# Patient Record
Sex: Female | Born: 1945 | Race: White | Marital: Married | Smoking: Never smoker
Health system: Northeastern US, Academic
[De-identification: ages and names within clinical notes are randomized; demographics above are authoritative.]

## PROBLEM LIST (undated history)

## (undated) DIAGNOSIS — N200 Calculus of kidney: Secondary | ICD-10-CM

## (undated) DIAGNOSIS — M8580 Other specified disorders of bone density and structure, unspecified site: Secondary | ICD-10-CM

## (undated) HISTORY — PX: ADENOIDECTOMY: SHX28B

## (undated) HISTORY — DX: Calculus of kidney: N20.0

## (undated) HISTORY — PX: CONVERTED PROCEDURE: SHX9999

## (undated) HISTORY — DX: Other specified disorders of bone density and structure, unspecified site: M85.80

## (undated) HISTORY — PX: TONSILLECTOMY: SHX28A

---

## 2005-11-23 DIAGNOSIS — Z91018 Allergy to other foods: Secondary | ICD-10-CM | POA: Insufficient documentation

## 2005-11-23 DIAGNOSIS — T7840XA Allergy, unspecified, initial encounter: Secondary | ICD-10-CM | POA: Insufficient documentation

## 2008-05-10 DIAGNOSIS — H269 Unspecified cataract: Secondary | ICD-10-CM | POA: Insufficient documentation

## 2008-05-10 DIAGNOSIS — H8110 Benign paroxysmal vertigo, unspecified ear: Secondary | ICD-10-CM | POA: Insufficient documentation

## 2008-06-14 DIAGNOSIS — M8580 Other specified disorders of bone density and structure, unspecified site: Secondary | ICD-10-CM | POA: Insufficient documentation

## 2008-06-14 LAB — HM DEXA SCAN

## 2009-01-30 ENCOUNTER — Ambulatory Visit: Payer: Self-pay | Admitting: Primary Care

## 2009-05-13 ENCOUNTER — Ambulatory Visit: Payer: Self-pay | Admitting: Primary Care

## 2009-05-13 ENCOUNTER — Other Ambulatory Visit: Payer: Self-pay | Admitting: Primary Care

## 2009-05-13 ENCOUNTER — Encounter: Payer: Self-pay | Admitting: Gastroenterology

## 2009-05-13 DIAGNOSIS — Z87442 Personal history of urinary calculi: Secondary | ICD-10-CM | POA: Insufficient documentation

## 2009-05-13 LAB — URINALYSIS WITH REFLEX TO MICROSCOPIC
Ketones, UA: NEGATIVE
Nitrite,UA: NEGATIVE
Protein,UA: NEGATIVE mg/dL
Specific Gravity,UA: 1.009 (ref 1.002–1.030)
pH,UA: 6 (ref 5.0–8.0)

## 2009-05-13 LAB — URINE MICROSCOPIC (IQ200)
RBC,UA: 1 /HPF (ref 0–2)
WBC,UA: 10 /HPF — AB (ref 0–5)

## 2009-05-13 NOTE — Progress Notes (Signed)
Reason For Visit   Chief complaint: Blood in urine.  HPI   Last yesterday afternoon she had urinary urgency and she had too quickly   run to the bathroom.  When she wiped herself there was blood on the toilet   paper and in the toilet water.  For the next two hours she had pressure and   urinary urgency.  As the day progressed she had some clots in the urine.    She denied any dysuria.  This morning the urine looked cloudy, but she   denies any blood in the urine.       She denies any recent abdominal pain, fever, chills and nausea.  She did   have some mild lower back pain and did stretches, which helped.  The lower   back painwas likely secondary to being on her feet for a prolonged period   of time the day prior.  A long time ago she had a uti with dysuria, but   nothing like this in the past.   She denies a history of kidney stones.       Medication reconciliation completed by Ivy Lynn, MD.  Allergies   No Known Allergies.  Current Meds   Calcium 600+D 600-400 MG-UNIT Tablet;; RPT  Vitamin C 500 MG Tablet;; RPT  Multivitamins Capsule;; RPT.  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  ROS   CONSTITUTIONAL: felt cold yesterday evening, Appetite good, no fevers,   chills or weight loss  CV: No chest pain, palps or peripheral edema  RESPIRATORY: No cough, wheezing or dyspnea  GI: No nausea/vomiting, abdominal pain, or change in bowel habits  NEURO: No headaches, no lightheadedness.  Vital Signs   Recorded by Roberts,Cornelia on 13 May 2009 01:26 PM  BP:120/70,  RUE,  Sitting,   HR: 88 b/min,  R Radial, Regular,   Weight: 153 lb.  Physical Exam   GENERAL APPEARANCE: Appears stated age, well appearing, NAD  EYES: conjunctiva clear, no icterus  LUNGS: Clear to auscultation without wheeze or crackles, unlabored breathing  HEART: Normal S1,S2 without  murmurs, gallops, or rubs  ABDOMEN: NABS, soft, non-tender, without hepato-splenomegaly, nondistended,   no CVA tenderness  EXTREMITIES: Without clubbing, cyanosis, or edema  PSYCH: alert and orientated x3, mood and affect appropriate.  POCT   URINALYSIS  May 13, 2009   2:05PM     pH:  5..........normal = 5.0-8.0  Leukocytes:  +1..........normal = negative  Nitrite:  neg..........normal = negative  Protein:  neg mg/dL..........normal = negative  Glucose:  normal mg/dL..........normal = negative  Ketones:  neg..........normal = negative  Urobilinogen:  normal mg/dl..........normal = less than 1 mg/dl  Bilirubin:  neg..........normal = negative  Blood:  about 50/Ery..........normal = negative.  Plan   Assessment and Plan:     1. hematuria  She likely passed a kidney stone, also need to rule out UTI, renal cyst and   bladder polyp  Will send urine for urinalysis and culture and lab  Ultrasound of kidneys and bladder  Refer to urology for further evaluation  Advised her to increase fluids  She will call if she develops any further hematuria, fevers, abdominal   pain, back pain or dysuria.  Signature   Electronically signed by: Clement Husbands  M.D.; 05/13/2009 2:06 PM EST;   Chartered loss adjuster.

## 2009-05-15 LAB — AEROBIC CULTURE

## 2009-05-20 ENCOUNTER — Ambulatory Visit: Payer: Self-pay | Admitting: Urology

## 2009-05-20 ENCOUNTER — Other Ambulatory Visit: Payer: Self-pay | Admitting: Urology

## 2009-06-23 ENCOUNTER — Ambulatory Visit: Payer: Self-pay | Admitting: Urology

## 2009-06-25 LAB — MEDICAL CYTOLOGY

## 2009-07-16 ENCOUNTER — Ambulatory Visit: Payer: Self-pay | Admitting: Primary Care

## 2009-07-16 NOTE — Progress Notes (Signed)
Reason For Visit   Chief complaint: Cough, chest tightness.  HPI   Her symptoms started about two weeks ago.  Initially her symptoms improved,   but over the weekend they worsened again.  She thinks she may have done too   much over the weekend and did not rest enough.  She has a dry cough   especially at night, tightness in her chest, headaches and she is tired.    She initially had a sore throat, but that resolved.  She denies   fever/chills, change in appetite, ear pain, nasal congestion, sinus pain,   wheeze, sob, chest pain, nausea, vomiting, abdominal pain, diarrhea,   myalgias and lightheadedness.  Allergies   No Known Allergies.  Current Meds   Multivitamins Capsule;; RPT  Vitamin C 500 MG Tablet;; RPT  Calcium 600+D 600-400 MG-UNIT Tablet;; RPT  Ciprofloxacin HCl 250 MG Tablet;TAKE 1 TABLET TWICE DAILY; Rx.  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  ROS   See HPI.  Vital Signs   Recorded by Fenn-Belt,Lola on 16 Jul 2009 11:47 AM  BP:130/80,  RUE,  Sitting,   HR: 68 b/min,   Weight: 150 lb.  Physical Exam   GENERAL APPEARANCE: Appears stated age, well appearing, NAD  EYES: no icterus, conjuctiva clear  HEENT: normal TM and ear canals, no oropharynx erythema, no thyromegaly, no   cervical or supraclavicular lymphadenopathy  LUNGS: Clear to auscultation without wheeze or crackles, unlabored   breathing, good air entry  HEART: Normal S1,S2 without murmurs, gallops, or rubs  EXTREMITIES: Without clubbing, cyanosis, or edema  .  Plan   A/P:     1. URI  likely viral   symptomatic treatment discussed  tussionex cough syrup q12 prn  increase rest, fluids     call if no improvement.  Signature   Electronically signed by: Clement Husbands  M.D.; 07/16/2009 2:40 PM EST;   Chartered loss adjuster.

## 2009-08-01 ENCOUNTER — Ambulatory Visit
Admit: 2009-08-01 | Disposition: A | Discharge status: Home / Self Care | Payer: Self-pay | Source: Ambulatory Visit | Attending: Gastroenterology | Admitting: Gastroenterology

## 2009-08-01 DIAGNOSIS — Z86010 Personal history of colonic polyps: Secondary | ICD-10-CM | POA: Insufficient documentation

## 2009-08-01 LAB — HM COLONOSCOPY

## 2009-08-04 LAB — SURGICAL PATHOLOGY

## 2009-08-04 NOTE — Op Note (Addendum)
SURGEON:  Sherrlyn Hock, MD  CO-SURGEON:  ASSISTANT:  SURGERY DATE:  08/01/2009      PREPROCEDURE DIAGNOSIS:       History of colon polyp.    POSTPROCEDURE DIAGNOSIS:      Colon polyps.    TITLE OF PROCEDURE:           Colonoscopy.    ANESTHESIA:                   Fentanyl 50 mcg IV, Versed 5 mg IV.    DESCRIPTION OF PROCEDURE:     We discussed the procedure and its                                objectives, limitations and alternatives.  We                                reviewed the risks that included, but were                                not exclusively limited to, perforation,                                bleeding, drug reaction and infection.  We                                also reviewed the risks of sedation.  We                                discussed the possibility that colon cancer,                                colon polyps and important findings might be                                hard to visualize and might be missed by                                colonoscopy.  The patient's questions were                                answered and the patient gave consent.                                  Incremental sedation was given.  The scope                                was inserted into the rectum and carefully                                advanced to the cecum, where the appendiceal  opening and ileocecal valve were seen.  The                                preparation was good.  Careful inspection was                                performed during scope withdrawal.                                  The distal 5 cm of ileum were unremarkable.                                    A 5 mm ascending colon polyp and a 5 mm                                transverse colon polyp were removed with a                                snare.  The remainder of the colon appeared                                unremarkable.  Small internal hemorrhoids       were seen on retroflexed view of the rectum.                                The patient tolerated the procedure well.                                There were no apparent complications.    IMPRESSION:                   Colon polyps.  Because the patient has a                                history of colon polyps, I would recommend                                colonoscopy every 5 years.  However, if                                biopsies from today demonstrate villous or                                tubulovillous tissue, I would recommend                                colonoscopy in 3 years.  I recommend that she  follow a high-fiber, low-fat diet and that                                she try to avoid red and processed meat.  I                                advised her to start taking fish oil 1000 mg                                per day.  Please consider monitoring vitamin                                D levels.  She was advised to avoid using                                aspirin and anti-inflammatory medications and                                to avoid traveling for the next 7 days.  I                                recommend that she advise close relatives to                                begin colonoscopy at age 55 and undergo                                colonoscopy every 5 years.                Electronically Signed and Finalized  by  Sherrlyn Hock, MD 08/04/2009 06:58  _____________________________________________  Sherrlyn Hock, MD      DD:  08/01/2009  DT:  08/04/2009  5:54 A  DVI: [id number]  JH/SS2#[id number]    cc:  Sherrlyn Hock, MD       Christean Leaf, MD

## 2009-08-13 ENCOUNTER — Ambulatory Visit: Payer: Self-pay | Admitting: Primary Care

## 2009-08-14 NOTE — Progress Notes (Addendum)
Allergies   No Known Allergies.  Current Meds   Hydrocod Polst-Chlorphen Polst 10-8 MG/5ML Liquid Extended Release;TAKE 1   TEASPOONFUL EVERY 12 HOURS AS DIRECTED.; Rx  Multivitamins Capsule;; RPT  Vitamin C 500 MG Tablet;; RPT  Calcium 600+D 600-400 MG-UNIT Tablet;; RPT.  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  Vital Signs   Weight 150 pounds, blood pressure 110/80, heart rate 72 and regular,   temperature orally 97.9 degrees.  Misc. 10   CC: Had IV in right arm at level of the flexor crease of right elbow for   colonoscopy last week, having persistent redness and itching, concern   regarding infection.     HPI: [name] is a 64 year old woman who underwent colonoscopy last week, she   recalls having an IV in her right arm and some tape was placed around twice   a day around her right elbow flexor crease.  Upon removal of the adhesive   or tape she noticed some itching at that time and then that has progressed   since, and she has some patchy redness which is itchy and looks irritated   but is not painful.  She has no fever or chills.  No lymphangitic streaking   up the arm.  No pain on movement of the elbow.     EXAM: She has a patchy contact appearing dermatitis margins of which   approximate a square-shaped adhesive piece put on her arm last week.  No   evidence of cellulitis.  The elbow moves freely and there is no fluid or   signs of elbow infection.  No lymphangitic streaking.  She has no axillary   adenopathy in the right.  Her forearm and hand look normal.     ASSESSMENT AND PLANS:     Contact dermatitis from adhesive tape occurring last week.  I will treat   this with triamcinolone 0.1% cream on a twice a day basis  until clear, she   is asked to call our office next week just to give a progress report.  She   is instructed to call sooner if necessary if the condition worsens or is   not otherwise doing well.  Signature   Electronically signed by: Loreen Freud  M.D.; 08/13/2009 4:31 PM EST.  Electronically signed by: Clement Husbands  M.D.; 08/14/2009 9:55 AM EST.

## 2009-12-30 ENCOUNTER — Other Ambulatory Visit: Payer: Self-pay | Admitting: Urology

## 2009-12-30 ENCOUNTER — Ambulatory Visit: Payer: Self-pay | Admitting: Urology

## 2010-03-03 NOTE — Letter (Signed)
 December 30, 2009    Christean Leaf, MD  [address]  [address]  [place], [place]  [address]      RE:   [name], Miranda  DOB:  01-30-1946  Unit#: [id number]    Dear Dr. Jarold Song:    I had the pleasure of seeing [name] in the urology clinic today in  follow-up for her history of hematuria and calcification in the left  kidney.  She had a one-shot x-ray prior to her appointment today.  It  showed no evidence of renal or ureteral calculi.  Ms. Jose has had  no flank pain, gross hematuria, dysuria, or urinary tract infections.  She  overall appears to be doing quite well.  At this point in time, given the  results of the KUB, she need only follow up with me on a p.r.n. basis.    I will keep you updated regarding all urologic care.        Sincerely,              Electronically Signed and Finalized by  Hedda Slade, MD 01/05/2010 08:47  ____________________________________  Hedda Slade, MD          DD:   12/30/2009  DT:   12/31/2009  9:03 A  DVI:  [id number]  JO/LD4#[id number]      cc:   Christean Leaf, MD

## 2010-03-03 NOTE — Letter (Signed)
May 20, 2009    Christean Leaf, MD  [address] [name], [place]  [address]      RE:   Miranda, [name]  DOB:  03-05-1945  Unit#: [id number]    Dear Dr. Jarold Song:    I saw [name] in urology clinic today with a chief complaint of  blood in the urine.  As you know, Ms. Sucher is a 65 year old female  with a history of gross painless hematuria who presents for further  evaluation.  She had a renal ultrasound completed, which showed stones.  She said she had severe urgency associated with the hematuria over a 2-hour  period, and the blood had resolved by several hours later that day.  She  has never had a previous incident similar to this.  She comes in for  further evaluation.    CURRENT MEDICATIONS:  Cipro currently for a bladder infection, which was  diagnosed after the gross hematuria.    ALLERGIES:  No latex allergy.  No known drug allergies.    REVIEW OF SYSTEMS:  Neurologic:  Positive for dizzy spells.  Ear/nose/throat:  Ringing in her ears.  Genitourinary:  Urinary retention.  The remaining review of systems is negative.    PAST SURGICAL HISTORY:  Tonsillectomy.    PAST MEDICAL HISTORY:  Benign positional vertigo, cataracts, and  osteopenia.    FAMILY HISTORY:  Breast cancer, gallbladder cancer, atrial fibrillation,  and hypertension.    SOCIAL HISTORY:  No tobacco.  One to 2 glasses of wine a week.    PHYSICAL EXAMINATION:  On physical exam, this is a well-developed,  well-nourished female in no acute distress.  Vital signs are reviewed and  recorded in [employer].  Neck is supple.  Respiratory effort is easy.  Lungs are clear.  Heart is regular rate and rhythm.  Abdomen is soft,  positive bowel sounds, nontender, nondistended.  No CVA tenderness  bilaterally.  No cervical, groin, or axillary lymphadenopathy.  The skin  and subcutaneous tissue are normal.  She is alert and oriented x4.  Mood  and affect are normal.    LABORATORY DATA:  Urine analysis today is normal,  showing no evidence of  infection or blood.  Her culture from the 22nd showed greater than 100,000  E. coli.    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  I have  contacted [facility] to see if they can correct this  discrepancy.  She does have scattered nonobstructing stones measuring up to  3 mm.    A KUB, which I obtained after her visit today, was unable to show the  evidence of these calcifications, but which I did tell Avree were so small it  is unlikely that this had passed and caused the gross hematuria she  experienced.  It is more likely that it was secondary to the severe bladder  infection that she did have.  In order to further evaluate her lower  tracts, we will schedule her for cystoscopy.  I explained this procedure to  her in detail.  We will get this on the schedule in the very near future.      I will keep you updated regarding all urologic care.    Sincerely,  Electronically Signed and Finalized by  Hedda Slade, MD 06/03/2009 14:19  ____________________________________  Hedda Slade, MD          DD:   05/20/2009  DT:   05/20/2009  4:34 P  DVI:  [id number]  JO/VL#[id number]      cc:   Christean Leaf, MD

## 2010-03-03 NOTE — Miscellaneous (Unsigned)
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)     Family History  Fraternal history of Adult Sleep Apnea  Paternal history of Atrial Fibrillation  Maternal history of Breast Cancer (V16.3)   Maternal history of Carcinoma Of The Gallbladder  Fraternal history of Hypertension (V17.49)   Maternal history of Malignant Hyperthermia Due To Anesthesia    Social History  Being A Social Drinker  Daily Coffee Consumption (___ Cups/Day)  Daily Tea Consumption (___ Cups/Day)  Marital History - Currently Married  Never Smoked  Occupation:  Seeing A Restaurant manager, fast food - [employer]  No Housing Without Psychologist, sport and exercise  No Lack Of Adequate Sleep  No Tobacco Use  No Drug Use    Alerts  Allergy - No Known Allergies     Medications  Calcium 600+D 600-400 MG-UNIT Tablet ; RPT   Multivitamins Capsule ; RPT   Triamcinolone Acetonide 0.1 % Cream; APPLY SPARINGLY AND MASSAGE IN TWICE   DAILY. ; Rx   Vitamin C 500 MG Tablet ; RPT     Immunizations  Td   Tdap (Adacel)   Zoster (Zostavax)

## 2010-03-03 NOTE — Letter (Signed)
Jun 23, 2009    Christean Leaf, MD  [address]  [place], [place]  [address]      RE:   Miranda, [name]  DOB:  Feb 08, 1946  Unit#: [id number]    Dear Dr. Jarold Song:    [name] underwent an uncomplicated cystoscopy today for her history  of gross hematuria.    Cystoscopy showed no evidence of bladder masses, tumors, or stones, clear  efflux from the bilateral ureteral orifices.  She had bladder neck polyp  which is pretty common but otherwise no evidence of abnormality.  I did  complete a bladder wash for cytology, and bilateral vaginal exam was  normal.    At this point in time, [name] will follow up with me in 6 months with a KUB  prior to assess her history of upper tract calcifications.    I will keep you updated regarding all urologic care.      Sincerely,              Electronically Signed and Finalized by  Hedda Slade, MD 06/30/2009 13:37  ____________________________________  Hedda Slade, MD          DD:   06/23/2009  DT:   06/24/2009  1:16 A  DVI:  [id number]  UX/LK#[id number]      cc:   Christean Leaf, MD

## 2010-12-02 LAB — HM MAMMOGRAPHY

## 2010-12-03 ENCOUNTER — Encounter: Payer: Self-pay | Admitting: Gastroenterology

## 2010-12-25 ENCOUNTER — Encounter: Payer: Self-pay | Admitting: Primary Care

## 2011-03-01 ENCOUNTER — Encounter: Payer: Self-pay | Admitting: Primary Care

## 2011-03-01 ENCOUNTER — Ambulatory Visit: Payer: Self-pay | Admitting: Primary Care

## 2011-03-01 VITALS — BP 112/80 | HR 60 | Ht 59.5 in | Wt 155.0 lb

## 2011-03-01 DIAGNOSIS — J069 Acute upper respiratory infection, unspecified: Secondary | ICD-10-CM

## 2011-03-01 MED ORDER — CHLORPHENIRAMINE-HYDROCODONE 8-10 MG/5ML PO LQCR *A*
5.0000 mL | Freq: Two times a day (BID) | ORAL | Status: DC | PRN
Start: 2011-03-01 — End: 2011-04-15

## 2011-04-15 ENCOUNTER — Encounter: Payer: Self-pay | Admitting: Primary Care

## 2011-04-15 ENCOUNTER — Ambulatory Visit: Payer: Self-pay | Admitting: Primary Care

## 2011-04-15 VITALS — BP 122/80 | HR 68 | Temp 96.7°F | Ht 59.45 in | Wt 156.2 lb

## 2011-04-15 DIAGNOSIS — N390 Urinary tract infection, site not specified: Secondary | ICD-10-CM

## 2011-04-15 LAB — POCT URINALYSIS DIPSTICK
Bilirubin,Ur: NEGATIVE
Glucose,UA POCT: NORMAL
Ketones,UA POCT: NEGATIVE
Leuk Esterase,UA POCT: 2 — AB
Lot #: 21819501
Nitrite,UA POCT: NEGATIVE
PH,UA POCT: 5 (ref 5–8)
Specific gravity,UA POCT: 1.015 (ref 1.002–1.03)
Urobilinogen,UA: NORMAL

## 2011-04-15 LAB — URINALYSIS REFLEX TO CULTURE
Ketones, UA: NEGATIVE
Nitrite,UA: NEGATIVE
Protein,UA: 30 mg/dL — AB
Specific Gravity,UA: 1.017 (ref 1.002–1.030)
pH,UA: 6 (ref 5.0–8.0)

## 2011-04-15 LAB — URINE MICROSCOPIC (IQ200)
RBC,UA: 366 /hpf — AB (ref 0–2)
WBC,UA: 725 /hpf — ABNORMAL HIGH (ref 0–5)

## 2011-04-15 MED ORDER — CIPROFLOXACIN HCL 500 MG PO TABS *I*
500.0000 mg | ORAL_TABLET | Freq: Two times a day (BID) | ORAL | Status: AC
Start: 2011-04-15 — End: 2011-04-20

## 2011-04-15 NOTE — Addendum Note (Signed)
Addended by: Oswaldo Milian on: 04/15/2011 11:50 AM     Modules accepted: Orders

## 2011-04-15 NOTE — Progress Notes (Signed)
Subjective:      Patient ID: [name] is a 66 y.o. year old female.    Chief Complaint   Patient presents with    Urinary Tract Infection    Urinary Frequency       HPI:    Presents to the office today with two-day duration of urinary frequency, slight burning on urination, and urinary urgency.  She has had urinary tract infection the past.  She denies any fever, chills, back pain, abdominal pain, hematuria, or vaginal discharge.    Medication reviewed and updated.    Allergy /Medications/ Problem List/ Social History / :  No Known Allergies (drug, envir, food or latex)  Current Outpatient Prescriptions on File Prior to Visit   Medication Sig Dispense Refill    Calcium Carbonate-Vitamin D (CALCIUM 600 + D) 600-400 MG-UNIT per tablet     0    Ascorbic Acid (VITAMIN C) 500 MG tablet     0    Multiple Vitamin (MULTIVITAMIN) capsule     0     Patient Active Problem List   Diagnoses Code    Allergic Reaction 995.3    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 V15.05    Cataract 366.9    Benign Paroxysmal Positional Vertigo 386.11    Osteopenia 733.90    Gross Hematuria 599.71    Benign Polyps Of The Large Intestine 211.3     History   Substance Use Topics    Smoking status: Never Smoker     Smokeless tobacco: Never Used    Alcohol Use: Not on file         Objective:     Recent Lab Results:      No results found for this basename: Na, K, Cl, CO2, UN, creat, GFRC, GFRB, glu, HA1c, ca, PO4, PTH, VID25, UTPR, UCRR, malbr, malbm, mac, TSH, CHOL, TRIG, HDL, LDLC, CHHDCr      No results found for this basename: WBC, HGB, HCT, PLT, FE, IBC, FESAT, FER          Physical Exam:    Filed Vitals:    04/15/11 1019   BP: 122/80   Pulse: 68   Temp: 35.9 C (96.7 F)   Height: 1.51 m (4' 11.45")   Weight: 70.852 kg (156 lb 3.2 oz)      Estimated Body mass index is 31.07 kg/(m^2) as calculated from the following:    Height as of this encounter: 4' 11.449"(1.51 m).    Weight as of this encounter: 156 lb 3.2 oz(70.852 kg).   SpO2  Readings from Last 3 Encounters:   No data found for SpO2       General:  Alert, cooperative, nontoxic, and in NAD.  Lungs:  CTA without wheezes, rales, or rhonchi.    Heart:  S1 and S2 are noted. RRR. No murmurs, rubs, or gallops.    Abdomen: Soft, nontender, nondistended, normoactive bowel sounds, no organomegaly or rebound tenderness.  No CVA tenderness.      POCT Urinalysis Dipstick [[id number]] (Abnormal) Collected:04/15/11 1035 Specific gravity,UA POCT 1.015 Updated:04/15/11 1036 PH,UA POCT 5.0 Leuk Esterase,UA POCT +2 (A) Nitrite,UA POCT Negative Protein,UA POCT + 30mg /dL (A) mg/dL Glucose,UA POCT Normal Ketones,UA POCT Negative Urobilinogen,UA Normal Bilirubin,Ur Negative Blood,UA POCT About 250 (A) Exp date 05/2012 Lot # [id number]            Assessment / Plan:     1. UTI (lower urinary tract infection) -Urine dipstick is suggestive of urinary tract infection.  Will treat  with Cipro 500 mg by mouth twice a day x5 days.  Recommended increasing fluids.  She will call if her symptoms worsen or don't improve. Urinalysis with reflex to culture     POCT Urinalysis Dipstick            Orders Placed This Encounter   Procedures    POCT Urinalysis Dipstick     Patient's Medications   New Prescriptions    CIPROFLOXACIN (CIPRO) 500 MG TABLET    Take 1 tablet (500 mg total) by mouth 2 times daily for 5 days   Previous Medications    ASCORBIC ACID (VITAMIN C) 500 MG TABLET        CALCIUM CARBONATE-VITAMIN D (CALCIUM 600 + D) 600-400 MG-UNIT PER TABLET        MULTIPLE VITAMIN (MULTIVITAMIN) CAPSULE       Modified Medications    No medications on file   Discontinued Medications    CHLORPHENIRAMINE-HYDROCODONE (TUSSIONEX) 10-8 MG/5ML EXTENDED RELEASE SUSPENSION    Take 5 mLs by mouth every 12 hours as needed for Cough         Signed: Berton Lan, PA on 04/15/2011 at 10:37 AM

## 2011-04-15 NOTE — Patient Instructions (Signed)
A bladder infection (cystitis), or kidney infection (pyelonephritis) will usually respond to antibiotics. These are medications that kill germs. Take all the medicine given to you until it is gone. You may feel better in a few days, but TAKE ALL MEDICINE or the infection may not respond and become more difficult to treat. Response can generally be expected in 7 to 10 days.     HOME CARE INSTRUCTIONS    Drink a lot of fluid, 3 to 4 quarts a day. Cranberry juice is especially recommended, in addition to large amounts of water.    Avoid caffeine, tea and carbonated beverages (Coke, 7-Up, etc). They tend to irritate the bladder.    Alcohol may irritate the prostate.    Only take over-the-counter or prescription medicines for pain, discomfort, or fever as directed by your caregiver     TO PREVENT FURTHER INFECTIONS:    Empty the bladder often. Avoid holding urine for long periods of time.    After a bowel movement, women should cleanse from front to back. Use each tissue only once.    Empty the bladder before and after sexual intercourse.    If you develop back pain, fever, feel sick to your stomach (nausea), vomiting, or your problems (symptoms) are no better in 3 days, return to your caregiver. Return sooner if you are getting worse.   SEEK IMMEDIATE MEDICAL CARE IF YOU:    Develop severe back pain or lower abdominal pain.    Develop chills and fever.    Develop nausea or vomiting.    Have continued burning or discomfort with urination.

## 2011-04-16 LAB — AEROBIC CULTURE: Aerobic Culture: 0

## 2011-04-19 ENCOUNTER — Telehealth: Payer: Self-pay | Admitting: Primary Care

## 2011-04-19 NOTE — Telephone Encounter (Signed)
Message copied by Oliva Bustard on Mon Apr 19, 2011 11:46 AM  ------       Message from: Dorthie Santini L       Created: Mon Apr 19, 2011 11:44 AM                       ----- Message -----          From: Berton Lan, [place]          Sent: 04/19/2011  10:08 AM            To: Marthenia Rolling [place]              Can you call her and ask her if her symptoms of UTI have resolved?  Her urine came back as contaminant which means that it wasn't the greatest sample. Thanks

## 2011-04-19 NOTE — Telephone Encounter (Signed)
Pt notified of results. Her urinary symptoms have resolved.

## 2011-11-16 ENCOUNTER — Encounter: Payer: Self-pay | Admitting: Primary Care

## 2011-12-17 ENCOUNTER — Ambulatory Visit: Payer: Self-pay | Admitting: Primary Care

## 2011-12-17 ENCOUNTER — Encounter: Payer: Self-pay | Admitting: Primary Care

## 2011-12-17 ENCOUNTER — Telehealth: Payer: Self-pay | Admitting: Primary Care

## 2011-12-17 VITALS — BP 110/80 | HR 64 | Temp 96.8°F | Wt 154.0 lb

## 2011-12-17 DIAGNOSIS — R059 Cough, unspecified: Secondary | ICD-10-CM

## 2011-12-17 DIAGNOSIS — R0781 Pleurodynia: Secondary | ICD-10-CM

## 2011-12-17 DIAGNOSIS — J069 Acute upper respiratory infection, unspecified: Secondary | ICD-10-CM

## 2011-12-17 MED ORDER — CHLORPHENIRAMINE-HYDROCODONE 8-10 MG/5ML PO LQCR *A*
ORAL | Status: AC
Start: 2011-12-17 — End: 2011-12-21

## 2011-12-17 NOTE — Telephone Encounter (Signed)
Patient husband called stating the patient is experiencing cold like symptoms for about a week now.  Patient complains of cough, lungs hurting, congestion, raspy throat- not sure if she has a fever  Patient was hoping to get appointment today  Patient is aware SV is not in the office today  [phone]

## 2011-12-17 NOTE — Telephone Encounter (Signed)
Coming in today at 4:20p pt notified

## 2011-12-17 NOTE — Telephone Encounter (Signed)
Ok to schedule with me

## 2011-12-17 NOTE — Progress Notes (Signed)
Subjective:    CC: cough, back pain    Patient ID: [name] is a 66 y.o. female    HPI:  66 year old female who presents with a 3 to four-day history of cold symptoms.  She is been having a bad dry cough.  Yesterday she noticed feeling very achy all over.  No fevers or chills.  She denies any sinus drainage, sore throat, ear pain.  This morning when she woke up she was  coughing and noticed that she was having discomfort in her left side of chest/back.  Notices that the pain worsens with coughing, deep breathing.  Does make her breathing shallow a results of the pain.    Non-smoker. No recent travel. No leg swelling, or pain.  Using some cough medication for symptoms. Typically tussionex in the past would help.      Allergies, medications, reviewed, updated as documented below and in EMR    Current Outpatient Prescriptions   Medication   . Calcium Carbonate-Vitamin D (CALCIUM 600 + D) 600-400 MG-UNIT per tablet   . Ascorbic Acid (VITAMIN C) 500 MG tablet   . Multiple Vitamin (MULTIVITAMIN) capsule     No current facility-administered medications for this visit.    and No Known Allergies (drug, envir, food or latex)    Review of Systems    As per hpi    Objective:     Filed Vitals:    12/17/11 1619   BP: 110/80   Pulse: 64   Temp: 36 C (96.8 F)   Weight: 69.854 kg (154 lb)   02 sat 98     Physical Exam   Vitals reviewed.  Constitutional: She is oriented to person, place, and time. She appears well-developed and well-nourished.   Cardiovascular: Normal rate, regular rhythm and normal heart sounds.    Pulmonary/Chest: Effort normal and breath sounds normal. No respiratory distress. She has no wheezes. She has no rales. She exhibits no tenderness.   Pain noted with deep breathing coughing   Neurological: She is alert and oriented to person, place, and time.   Skin: Skin is warm.   Psychiatric: She has a normal mood and affect.       Assessment and Plan:     66 year old female with pleuritic chest pains.   Suspect viral respiratory infection, possible pna.  Differential diagnosis does include rib fracture secondary to cough (however no pain with palpation).      -Chest x-ray  -Hold on antibiotics at this time as I suspect this is a viral process  -Prescription given for Tussionex.   I have checked the prescription monitoring program and have noted no issues with this controlled substance prescription.  -discussed the importance of fluids, rest, use of tylenol or advil for pain.    Call if any fevers, chills, worsened pain, difficulty breathing or more productive cough of purulent sputum or no resolution in symptoms in 3-5 days

## 2011-12-17 NOTE — Patient Instructions (Signed)
-   Use Tylenol OTC as needed to help control pain symptoms  - Use Ibuprofen OTC as needed to help control pain symptoms  - Side effects and proper use of narcotic cough suppressants discussed  - Steam treatments for congestion  - Push fluids to maintain proper hydration status  - Call office if symptoms are unchanged, progress, or worsen.  Such as if sputum turns green or brown, fever above 101 F, any shortness of breath, symptoms worsen after 5 to 7 days

## 2011-12-20 ENCOUNTER — Telehealth: Payer: Self-pay | Admitting: Primary Care

## 2011-12-20 NOTE — Telephone Encounter (Signed)
Notified patient of results. She said she feels that she is improving. She slept a lot over the weekend and feels better today. Advised her that is she does not continue to improve to call the office.

## 2011-12-20 NOTE — Telephone Encounter (Signed)
Message copied by Lynnae Prude on Mon Dec 20, 2011  3:52 PM  ------       Message from: [name]       Created: Mon Dec 20, 2011  3:15 PM         Call patient. Xray normal.  How has she been feeling?  ------

## 2012-03-23 ENCOUNTER — Encounter: Payer: Self-pay | Admitting: Gastroenterology

## 2012-03-23 LAB — HM MAMMOGRAPHY

## 2012-03-28 ENCOUNTER — Encounter: Payer: Self-pay | Admitting: Primary Care

## 2012-04-21 ENCOUNTER — Telehealth: Payer: Self-pay | Admitting: Primary Care

## 2012-04-21 ENCOUNTER — Other Ambulatory Visit: Payer: Self-pay | Admitting: Orthopedic Surgery

## 2012-04-21 ENCOUNTER — Other Ambulatory Visit: Payer: Self-pay | Admitting: Emergency Medicine

## 2012-04-21 ENCOUNTER — Encounter: Payer: Self-pay | Admitting: Emergency Medicine

## 2012-04-21 ENCOUNTER — Inpatient Hospital Stay
Admission: EM | Admit: 2012-04-21 | Disposition: A | Discharge status: Home / Self Care | Payer: Self-pay | Source: Ambulatory Visit | Attending: Orthopedic Surgery | Admitting: Orthopedic Surgery

## 2012-04-21 DIAGNOSIS — S82409A Unspecified fracture of shaft of unspecified fibula, initial encounter for closed fracture: Secondary | ICD-10-CM

## 2012-04-21 DIAGNOSIS — S82209A Unspecified fracture of shaft of unspecified tibia, initial encounter for closed fracture: Secondary | ICD-10-CM

## 2012-04-21 DIAGNOSIS — Z8489 Family history of other specified conditions: Secondary | ICD-10-CM | POA: Diagnosis present

## 2012-04-21 HISTORY — DX: Unspecified fracture of shaft of unspecified fibula, initial encounter for closed fracture: S82.409A

## 2012-04-21 HISTORY — DX: Unspecified fracture of shaft of unspecified tibia, initial encounter for closed fracture: S82.209A

## 2012-04-21 LAB — BASIC METABOLIC PANEL
Anion Gap: 11 (ref 7–16)
CO2: 27 mmol/L (ref 20–28)
Calcium: 9.2 mg/dL (ref 8.6–10.2)
Chloride: 106 mmol/L (ref 96–108)
Creatinine: 0.85 mg/dL (ref 0.51–0.95)
GFR,Black: 81 *
GFR,Caucasian: 71 *
Glucose: 115 mg/dL — ABNORMAL HIGH (ref 60–99)
Lab: 19 mg/dL (ref 6–20)
Potassium: 4.6 mmol/L (ref 3.3–5.1)
Sodium: 144 mmol/L (ref 133–145)

## 2012-04-21 LAB — CBC AND DIFFERENTIAL
Baso # K/uL: 0 10*3/uL (ref 0.0–0.1)
Basophil %: 0.2 % (ref 0.1–1.2)
Eos # K/uL: 0 10*3/uL (ref 0.0–0.4)
Eosinophil %: 0 % — ABNORMAL LOW (ref 0.7–5.8)
Hematocrit: 40 % (ref 34–45)
Hemoglobin: 14.2 g/dL (ref 11.2–15.7)
Lymph # K/uL: 1 10*3/uL — ABNORMAL LOW (ref 1.2–3.7)
Lymphocyte %: 11.1 % — ABNORMAL LOW (ref 19.3–51.7)
MCV: 88 fL (ref 79–95)
Mono # K/uL: 0.5 10*3/uL (ref 0.2–0.9)
Monocyte %: 5.9 % (ref 4.7–12.5)
Neut # K/uL: 7.2 10*3/uL — ABNORMAL HIGH (ref 1.6–6.1)
Platelets: 300 10*3/uL (ref 160–370)
RBC: 4.6 MIL/uL (ref 3.9–5.2)
RDW: 14.1 % (ref 11.7–14.4)
Seg Neut %: 82.8 % — ABNORMAL HIGH (ref 34.0–71.1)
WBC: 8.7 10*3/uL (ref 4.0–10.0)

## 2012-04-21 LAB — PROTIME-INR
INR: 1 (ref 1.0–1.2)
Protime: 10.3 s (ref 9.2–12.3)

## 2012-04-21 LAB — HOLD LAVENDER

## 2012-04-21 LAB — HOLD RED

## 2012-04-21 LAB — BLOOD BANK HOLD RED

## 2012-04-21 LAB — APTT: aPTT: 28.1 s (ref 25.8–37.9)

## 2012-04-21 LAB — HOLD BLUE

## 2012-04-21 LAB — HOLD SST

## 2012-04-21 LAB — BLOOD BANK HOLD LAVENDER

## 2012-04-21 LAB — HOLD GREEN WITH GEL

## 2012-04-21 MED ORDER — SODIUM CHLORIDE 0.9 % IV SOLN WRAPPED *I*
100.0000 mL/h | Status: DC
Start: 2012-04-21 — End: 2012-04-23
  Administered 2012-04-21 (×2): 100 mL/h via INTRAVENOUS

## 2012-04-21 MED ORDER — OXYCODONE-ACETAMINOPHEN 5-325 MG PO TABS *I*
2.0000 | ORAL_TABLET | ORAL | Status: DC | PRN
Start: 2012-04-21 — End: 2012-04-23

## 2012-04-21 MED ORDER — ONDANSETRON HCL 2 MG/ML IV SOLN *I*
4.0000 mg | Freq: Four times a day (QID) | INTRAMUSCULAR | Status: DC | PRN
Start: 2012-04-21 — End: 2012-04-23

## 2012-04-21 MED ORDER — HYDROMORPHONE HCL 2 MG/ML IJ SOLN *WRAPPED*
0.4000 mg | INTRAMUSCULAR | Status: DC | PRN
Start: 2012-04-21 — End: 2012-04-21

## 2012-04-21 MED ORDER — CALCIUM CARBONATE-VITAMIN D 500-200 MG-UNIT PO TABS *WRAPPED*
1.0000 | ORAL_TABLET | Freq: Every day | ORAL | Status: DC
Start: 2012-04-22 — End: 2012-04-23
  Administered 2012-04-22: 1 via ORAL
  Filled 2012-04-21 (×2): qty 1

## 2012-04-21 MED ORDER — PROPOFOL 10 MG/ML IV EMUL (INTERMITTENT DOSING) WRAPPED *I*
INTRAVENOUS | Status: DC
Start: 2012-04-21 — End: 2012-04-21
  Filled 2012-04-21: qty 60

## 2012-04-21 MED ORDER — PROMETHAZINE HCL 25 MG/ML IJ SOLN *I*
6.2500 mg | Freq: Once | INTRAMUSCULAR | Status: DC | PRN
Start: 2012-04-21 — End: 2012-04-21

## 2012-04-21 MED ORDER — CEFAZOLIN SODIUM 1000 MG IJ SOLR *I*
1000.0000 mg | Freq: Three times a day (TID) | INTRAMUSCULAR | Status: AC
Start: 2012-04-21 — End: 2012-04-22
  Administered 2012-04-22 (×2): 1000 mg via INTRAVENOUS
  Filled 2012-04-21: qty 10

## 2012-04-21 MED ORDER — HYDROMORPHONE HCL PF 1 MG/ML IJ SOLN *WRAPPED*
0.2500 mg | INTRAMUSCULAR | Status: DC | PRN
Start: 2012-04-21 — End: 2012-04-23

## 2012-04-21 MED ORDER — BISACODYL 10 MG RE SUPP *I*
10.0000 mg | Freq: Every day | RECTAL | Status: DC | PRN
Start: 2012-04-21 — End: 2012-04-23

## 2012-04-21 MED ORDER — ENOXAPARIN SODIUM 40 MG/0.4ML IJ SOSY *I*
40.0000 mg | PREFILLED_SYRINGE | INTRAMUSCULAR | Status: DC
Start: 2012-04-22 — End: 2012-04-23

## 2012-04-21 MED ORDER — HALOPERIDOL LACTATE 5 MG/ML IJ SOLN *I*
0.5000 mg | Freq: Once | INTRAMUSCULAR | Status: DC | PRN
Start: 2012-04-21 — End: 2012-04-21

## 2012-04-21 MED ORDER — SENNOSIDES 8.6 MG PO TABS *I*
2.0000 | ORAL_TABLET | Freq: Every day | ORAL | Status: DC
Start: 2012-04-22 — End: 2012-04-23

## 2012-04-21 MED ORDER — ONDANSETRON HCL 2 MG/ML IV SOLN *I*
1.0000 mg | Freq: Once | INTRAMUSCULAR | Status: DC | PRN
Start: 2012-04-21 — End: 2012-04-21

## 2012-04-21 MED ORDER — PROPOFOL 10 MG/ML IV EMUL (INTERMITTENT DOSING) WRAPPED *I*
INTRAVENOUS | Status: AC | PRN
Start: 2012-04-21 — End: 2012-04-21
  Administered 2012-04-21: 50 mg via INTRAVENOUS

## 2012-04-21 MED ORDER — DOCUSATE SODIUM 100 MG PO CAPS *I*
200.0000 mg | ORAL_CAPSULE | Freq: Every evening | ORAL | Status: DC
Start: 2012-04-22 — End: 2012-04-23

## 2012-04-21 MED ORDER — MAGNESIUM HYDROXIDE 400 MG/5ML PO SUSP *I*
30.0000 mL | Freq: Every day | ORAL | Status: DC | PRN
Start: 2012-04-21 — End: 2012-04-23
  Filled 2012-04-21 (×2): qty 30

## 2012-04-21 MED ORDER — OXYCODONE-ACETAMINOPHEN 5-325 MG PO TABS *I*
1.0000 | ORAL_TABLET | ORAL | Status: DC | PRN
Start: 2012-04-21 — End: 2012-04-23

## 2012-04-21 NOTE — Progress Notes (Signed)
Patient:[name]  MRN: [id number]  DOA: 04/21/2012  Ortho Progress Note for 04/21/2012    Events:   Doing well, pain controlled in pacu      Vitals:  Filed Vitals:    04/21/12 2030   BP:    Pulse:    Temp:    Resp: 16   Height:    Weight:        Intake/Output:  I/O this shift:  02/28 1500 - 02/28 2259  In: 1800 (26.1 mL/kg) [I.V.:1800]  Out: 20 (0.3 mL/kg) [Blood:20]  Net: 1780  Weight used: 68.9 kg    Recent Labs:    Recent Labs  Lab 04/21/12  1523   WBC 8.7   Hematocrit 40   Creatinine 0.85   INR 1.0   Platelets 300   Potassium 4.6       Physical Exam:  NAD, AAOx3  LLE:  Splint c/d/i  Motor: Able to actively dorsiflex and plantarflex at the ankle, actively flexes and extends all toes   Sensory: SILT med/lat thigh, med/lat leg, and medial/lateral/dorsal/plantar surfaces of foot including first web space  all digits wwp, cap refill <3secs      Assessment and Plan:  67 y.o. female admitted on 04/21/2012 now s/p ORIF L ankle on 04/21/12    -Analgesia: percocet  -Diet: regular  -DVT Prophylaxis: lovenox, will DC on ASA 325mg  PO daily   -Weight-bearing status: NWB LLE  -PT/OT/OOB  -Abx ancef post op  -Xrays pending  -Dispo: planning ongoing    Gabrielle Dare, MD, Orthopaedics Resident  04/21/2012  8:36 PM

## 2012-04-21 NOTE — H&P (Addendum)
Orthopaedic Surgery Consult Note    [name]  [id number]    CC: Left Ankle Pain    HPI: 67 y.o. female who slipped on ice this morning trying to get in the car. Denies numbness or tingling. Denies hitting head, or LOC. No other injuries.    Lives with husband. Ambulates independently.    NPO since 0830  Other injuries: None      PMH:  History reviewed. No pertinent past medical history.    PSH:  Past Surgical History   Procedure Laterality Date   . Converted procedure       Filleted Finger Flap Conversion Data        Meds:  No current facility-administered medications on file prior to encounter.     Current Outpatient Prescriptions on File Prior to Encounter   Medication Sig Dispense Refill   . Calcium Carbonate-Vitamin D (CALCIUM 600 + D) 600-400 MG-UNIT per tablet     0   . Ascorbic Acid (VITAMIN C) 500 MG tablet     0   . Multiple Vitamin (MULTIVITAMIN) capsule     0       Allergies:  No Known Allergies (drug, envir, food or latex)    Social History:  Denies smoking, occasional alcohol use, denies illicit drug use.  Lives with husband.    Family History:  Negative for DVT/PE.    ROS:  No CP/SOB/N/V/recent illnesses.  No abdominal pain, urinary complaints, denies diarrhea/constipation.  No easy bruising or recent skin changes.        Physical Exam:  Filed Vitals:    04/21/12 1119   BP: 174/89   Pulse: 88   Temp: 36 C (96.8 F)   Resp: 16   Height: 1.727 m (5\' 8" )   Weight: 68.947 kg (152 lb)       General: NAD    Pulm: No respiratory distress, no accessory muscle use.    Cardiac: Reg Rate    BUE: Skin intact. No TTP/creptius at the clavicle/shoulder/arm/elbow/forearm/wrist/hand. Gross movement from shoulder distal to digits intact.  Able to perform "OK" sign, thumbs up, cross fingers, abduct small finger, and flex/extennd wrist. SILT over dorsal first web space, volar 2nd and 5th digits. 2+ radial pulse.    Pelvis: No TTP, stable to AP/Lat Compression    RLE: Skin intact.  No swelling, ecchymosis. NTTP at  hip/thigh/leg/ankle/foot. Motor intact grossly. + ADF/APF, toe flex/ext. SILT 1st DWS/medial/lateral/plantar/dorsal foot. 2+ DP pulse.    LLE: Skin intact. Obvious deformity and swelling, TTP here. NTTP at hip/thigh/leg/foot. Motor intact grossly. + toe flex/ext. SILT 1st DWS/medial/lateral/plantar/dorsal foot. 2+ DP pulse.      Imaging: Radiographs show a left ankle fracture dislocation    A/P: 67 y.o. female with a left ankle fracture dislocation.    Under conscious sedation provided by the ED, the ankle was blocked with 1% Lidocaine and was reduced and splinted. The joint was very unstable was was subluxing during the reduction. Post op x-rays taken are not optimal at this time.    1. WSOR for ORIF L Ankle  2. Bed rest, non-weight bearing LLE  3. Surgical fixation of fracture when medically optimized:   NPO after midnight day before surgery   IV fluids while NPO   CBC, Chem-7, PT/INR, type and screen   EKG   Chest x-ray   Antibiotics on call to OR  4. MRSA screening   5. DVT prophylaxis: hold pre-operatively      Myrtice Lauth, MD,  on 05/02/12, at 2:13 PM      Orthopaedic Attending    I saw and evaluated the patient.  The resident note was reviewed and confirmed.  I discussed the surgery, perioperative recovery period, as well as the risks and benefits with the patient.  All questions were answered.  The patient consented to surgical intervention.  They are aware of the risks of surgery including, but not limited to bleeding, infection, damage to soft tissues including the nerves and vessels, nonunion, malunion, failure of fixation, hardware problems, superficial or deep wounds, possible amputation and the risks of anaesthesia including stroke heart attack and death.    Jerolyn Center, M.D.  05/02/12, 5:28 PM

## 2012-04-21 NOTE — Anesthesia Post-procedure Eval (Signed)
Anesthesia Post-op Note    Patient: [name]    Procedure(s) Performed: ORIF ankle    Anesthesia type: General    Patient location: PACU    Mental Status: Recovered to baseline    Patient able to participate in this evaluation: yes  Last Vitals: BP: 127/65 mmHg (04/21/12 1332)  BP MAP : 80 mmHg (04/21/12 1332)  Heart Rate: 87 (04/21/12 1332)  Temp: 36 C (96.8 F) (04/21/12 1119)  Resp: 20 (04/21/12 1332)  Height: 172.7 cm (5\' 8" ) (04/21/12 1119)  Weight: 68.947 kg (152 lb) (04/21/12 1119)  BMI (Calculated): 23.2 (04/21/12 1119)  SpO2: 98 % (04/21/12 1332)      Post-op vital signs noted above are within patient's normal range  Post-op vitals signs: stable  Respiratory function: baseline    Airway patent: Yes    Cardiovascular and hydration status stable: Yes    Post-Op pain: Adequate analgesia    Post-Op nausea and vomiting: none    Post-Op assessment: no apparent anesthetic complications    Complications: none    Attending Attestation: All indicated post anesthesia care provided    Author: Jodi Marble, MD  as of: 04/21/2012  at: 7:13 PM

## 2012-04-21 NOTE — Progress Notes (Signed)
Pt arrived from PACU via stretcher, on arrival pt ambulated to BR and voided clear yellow urine without any difficulty. Pt using front wheel rolling walker with good gait. Left LE with splint ace wrap remains clean dry and intact, CMS checks WNL positive movement and sensation to toes, denies any pain at this time. Left LE elevated on pillows with ice on. Started on clear liquids tolerating well with no c /o n/v. VSS afebrile, IVF infusing through patent and benign PIV. Oriented to room and call system call bell within reach will continue to monitor pt. Husband at bedside.

## 2012-04-21 NOTE — ED Notes (Signed)
Bed:PA-04<BR> Expected date:04/21/12<BR> Expected time:10:50 AM<BR> Means of arrival: Other<BR> Comments:<BR> ADULT CALL-IN----------    Patient Name: [name], [name]--------    AGE:----- DOB      Dec 31, 2045-------    PCP/Service Referral: [facility]    Patient Information Note: PT FELL THIS AM-----HAS L TIB/ FIB FX-----NEEDS ORTHO TO SEE---------    Tests/Orders Requested:    Vital Signs:    Relevant Medications:    Requested Evaluation By:    MD Requesting Call Back: yes/no     IF CALL BACK REQUESTED:    Notify:   At:    Is caller requesting admission for this patient?: yes/no    If yes, to which service?    Is referring physician an [facility] admitting provider?        Call reported to:    Author Jerolyn Shin, RN as of 04/21/2012 at 11:00 AM

## 2012-04-21 NOTE — First Provider Contact (Signed)
ED Medical Screening Exam Note    Initial provider evaluation performed by   ED First Provider Contact    Date/Time Event User Comments    04/21/12 1119 ED Provider First Contact Marye Eagen Initial Face to Face Provider Contact      67 yo female presents from Urgent Care after falling on ice and injuring left ankle/leg. No other complaints. No LOC          Saline lock ordered.      Patient requires further evaluation.     Conchita Paris, NP, 04/21/2012, 11:19 AM

## 2012-04-21 NOTE — Anesthesia Pre-procedure Eval (Signed)
Anesthesia Pre-operative Evaluation for [name]      Health History  History reviewed. No pertinent past medical history.  Past Surgical History   Procedure Laterality Date   . Converted procedure       Filleted Finger Flap Conversion Data      Social History  History   Substance Use Topics   . Smoking status: Never Smoker    . Smokeless tobacco: Never Used   . Alcohol Use: Not on file      History   Drug Use Not on file     ______________________________________________________________________  Allergies: No Known Allergies (drug, envir, food or latex)  Prior to Admission Medications              Last Dose Start Date End Date Provider     Ascorbic Acid (VITAMIN C) 500 MG tablet   05/10/08  --  Provider, Conversion          Calcium Carbonate-Vitamin D (CALCIUM 600 + D) 600-400 MG-UNIT per tablet   05/10/08  --  Provider, Conversion          Multiple Vitamin (MULTIVITAMIN) capsule   05/10/08  --  Provider, Conversion             Current Facility-Administered Medications   Medication   . HYDROmorphone PF (DILAUDID) injection 0.4 mg   . haloperidol lactate (HALDOL) injection 0.5 mg   . promethazine (PHENERGAN) injection 6.25 mg   . ondansetron (ZOFRAN) injection 1 mg   . sodium chloride 0.9 % IV   . ceFAZolin (ANCEF) injection 1,000 mg     Current Outpatient Prescriptions   Medication Sig   . Calcium Carbonate-Vitamin D (CALCIUM 600 + D) 600-400 MG-UNIT per tablet    . Ascorbic Acid (VITAMIN C) 500 MG tablet    . Multiple Vitamin (MULTIVITAMIN) capsule      Admission Medications:  Scheduled Meds IV Meds   PRN Meds     . HYDROmorphone PF     . haloperidol lactate     . promethazine     . ondansetron       Anesthesia EvaluationInformation Source: per patient  General    + History of anesthetic complications            MH  Comment: Mother had MH after her anesthetic. Miranda take full MH precautions with pt    HEENT  Denies HEENT issues    Pulmonary   Denies pulmonary issues Cardiovascular  Good(4+METs) Exercise  Tolerance    GI/Hepatic/Renal  Last PO Intake: >8hr before procedure     Neuro/Psych    Denies neuro/psych issues    Endo/Other    Denies endo issues    Hematologic  Denies hematologic issues     Nursing Reported PO Status:           ______________________________________________________________________  Physical Exam    Airway            Mouth opening: normal            Mallampati: II            TM distance (fb): <3 FB            TM distance (cm): 2            Neck ROM: full  Dental   Normal Exam    Pt has protruding top teeth   Cardiovascular           Rhythm: regular  Rate: normal     Pulmonary     breath sounds clear to auscultation    Mental Status     oriented to person, place and time         Most Recent Vitals: BP: 131/74 mmHg (04/21/12 1930)  BP MAP : 89 mmHg (04/21/12 1930)  Heart Rate: 86 (04/21/12 1930)  Temp: 36.5 C (97.7 F) (04/21/12 1915)  Resp: 16 (04/21/12 1930)  Height: 172.7 cm (5\' 8" ) (04/21/12 1119)  Weight: 68.947 kg (152 lb) (04/21/12 1119)  BMI (Calculated): 23.2 (04/21/12 1119)  SpO2: 98 % (04/21/12 1930)    Vital Sign Ranges (last 24hrs)  Temp:  [36 C (96.8 F)-36.5 C (97.7 F)] 36.5 C (97.7 F)  Heart Rate:  [78-96] 86  Resp:  [13-22] 16  BP: (119-174)/(51-89) 131/74 mmHg  FiO2:  [100 %] 100 %   O2 Device: Nasal cannula (04/21/12 1915)  FiO2: 100 % (04/21/12 1304)  O2 Flow Rate: 2 L/min (04/21/12 1915)    Most Recent Lab Results   Blood Type  No results found for this basename: aborh, abs   CBC  Lab Results   Component Value Date    WBC 8.7 04/21/2012    HCT 40 04/21/2012    PLT 300 04/21/2012   Chem-7  Lab Results   Component Value Date    NA 144 04/21/2012    K 4.6 04/21/2012    CL 106 04/21/2012    CO2 27 04/21/2012    UN 19 04/21/2012    CREAT 0.85 04/21/2012    GLU 115* 04/21/2012   Estimated Creatinine Clearance: 63.9 ml/min (based on Cr of 0.85).  Electrolytes  Lab Results   Component Value Date    CA 9.2 04/21/2012   Coags  Lab Results   Component Value Date    PTI 10.3  04/21/2012    INR 1.0 04/21/2012    PTT 28.1 04/21/2012   LFTs  No results found for this basename: AST, ALT, ALK     Bilirubin,Ur   Date Value Range Status   04/15/2011 Negative  Negative Final     Pregnancy Status: Postmenopausal [2]  No LMP recorded. Patient is postmenopausal.    No results found for this basename: PUPT, UPREG, SPREG, HCG1, BHCG2, BHCG, HCGB     ECG Results  No results found for this basename: rate, PR, statement     ANES CPM    Radiology: Recent Study Impressions No results found.    ________________________________________________________________________  Medical Problems  Patient Active Problem List    Diagnosis Date Noted   . Tibia/fibula fracture 04/21/2012   . Family history of malignant hyperthermia 04/21/2012   . 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 Gross Hematuria 05/13/2009     Created by Conversion       . Osteopenia 06/14/2008     Created by Conversion       . Cataract 05/10/2008     Created by Conversion       . Benign Paroxysmal Positional Vertigo 05/10/2008     Created by Conversion       . Allergic Reaction 11/23/2005     Created by Conversion  [facility] Annotation: Nov 23 2005  2:31PM -  ,  : cold medications make her hyper       .  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/2005     Created by Conversion  [facility] Annotation: Nov 23 2005  2:29PM -  ,  : ham           PreOp/PreProcedure Diagnosis (For more detail see procedural consent)            Fractured left ankle  Planned Procedure (For more detail see procedural consent)            ORIF left ankle  Plan   ASA Score 2  Anesthetic Plan (general); Induction (routine IV); Airway (cuffed ETT); Line ( use current access); Monitoring (standard ASA); Positioning (supine); Pain (per surgical team); PostOp (PACU)    Informed Consent     Risks:          Risks discussed were commensurate with the plan listed above with the  following specific points:  N/V, aspiration, sore throat , damage to:(eyes, nerves, teeth), awareness, unexpected serious injury, allergic Rx    Anesthetic Consent:         Anesthetic plan and risks discussed with:  patient and spouse    Blood products Consent:        Use of blood products discussed with: patient and spouse     Plan discussed with:  attending      PEC/PreOp Attestation: Anesthesia options were discussed with the patient or proxy and they understand the risks and benefits of the various anesthetic options.    Author: Versie Starks, CRNA

## 2012-04-21 NOTE — ED Notes (Signed)
Pt consciously sedated in 44L w/ Drs Lynelle Smoke and Rogelia Rohrer. Sedation completed in 44L d/t critical volume in trauma bay. Charge aware, this Clinical research associate responsible for only this pt during sedation. Code/airway cart, BVM, NRB, OPA, NPA, and suction at bedside for pt safety.    Pt consciously sedated w/ single dose of Propofol for reduction of L ankle. Splinted. CMS intact pre/post.

## 2012-04-21 NOTE — INTERIM OP NOTE (Signed)
Interim Op Note    Date of Surgery: 04/21/2012    Surgeon: Dr. Drinda Butts  Co-Surgeon:   Assistants: Lupe Carney; Genia Hotter    Pre-Op Diagnosis: left ankle fracture    Anesthesia Type: General    Post-Op Diagnosis:    Primary: same  Secondary:   Tertiary:     Additional Findings (Including unexpected complications): none    Procedure(s) Performed (including CPT 4 Code if available)   ORIF L ankle    Estimated Blood Loss: 25cc   Packing: No  Drains: No  Fluid Totals: Intakes: see anesthesia record Outputs: none  Specimens to Pathology: no  Patient Condition: good

## 2012-04-21 NOTE — Progress Notes (Signed)
HR has jumped into 120s periodically and then right back down to 70s. Anesthesia and surgical teams are aware. Assessment otherwise benign.

## 2012-04-21 NOTE — Anesthesia Pre-procedure Eval (Signed)
Anesthesia Pre-operative Evaluation for [name]  Pt's mother had MH after an appendectomy.  Pt is otherwise healthy, injured ankle when she slipped on ice.    Health History  History reviewed. No pertinent past medical history.  Past Surgical History   Procedure Laterality Date   . Converted procedure       Filleted Finger Flap Conversion Data      Social History  History   Substance Use Topics   . Smoking status: Never Smoker    . Smokeless tobacco: Never Used   . Alcohol Use: Not on file      History   Drug Use Not on file     ______________________________________________________________________  Allergies: No Known Allergies (drug, envir, food or latex)  Prior to Admission Medications        Last Dose Start Date End Date Provider     Ascorbic Acid (VITAMIN C) 500 MG tablet   05/10/08  --  Provider, Conversion          Calcium Carbonate-Vitamin D (CALCIUM 600 + D) 600-400 MG-UNIT per tablet   05/10/08  --  Provider, Conversion          Multiple Vitamin (MULTIVITAMIN) capsule   05/10/08  --  Provider, Conversion             No current facility-administered medications for this encounter.     Current Outpatient Prescriptions   Medication Sig   . Calcium Carbonate-Vitamin D (CALCIUM 600 + D) 600-400 MG-UNIT per tablet    . Ascorbic Acid (VITAMIN C) 500 MG tablet    . Multiple Vitamin (MULTIVITAMIN) capsule      Admission Medications:  Scheduled Meds   IV Meds   PRN Meds     Anesthesia EvaluationInformation Source: per patient  General    + Family Hx of Anesthetic Complications            MH    HEENT  Denies HEENT issues    Pulmonary   Denies pulmonary issues Cardiovascular  Good(4+METs) Exercise Tolerance    GI/Hepatic/Renal  Last PO Intake: >8hr before procedure      + Renal issues          kidney stones                  Nursing Reported PO Status:           ______________________________________________________________________  Physical Exam    Airway            Mouth opening: normal             Mallampati: II            TM distance (fb): <3 FB            Neck ROM: full  Dental   Normal Exam   Cardiovascular           Rhythm: regular           Rate: normal     Pulmonary   pulmonary exam normal    breath sounds clear to auscultation    Mental Status   Normal  evaluation         Most Recent Vitals: BP: 127/65 mmHg (04/21/12 1332)  BP MAP : 80 mmHg (04/21/12 1332)  Heart Rate: 87 (04/21/12 1332)  Temp: 36 C (96.8 F) (04/21/12 1119)  Resp: 20 (04/21/12 1332)  Height: 172.7 cm (5\' 8" ) (04/21/12 1119)  Weight: 68.947 kg (152 lb) (04/21/12 1119)  BMI (Calculated): 23.2 (04/21/12 1119)  SpO2: 98 % (04/21/12 1332)    Vital Sign Ranges (last 24hrs)  Temp:  [36 C (96.8 F)] 36 C (96.8 F)  Heart Rate:  [78-88] 87  Resp:  [13-22] 20  BP: (119-174)/(51-89) 127/65 mmHg  FiO2:  [100 %] 100 %   O2 Device: Nasal cannula (04/21/12 1326)  FiO2: 100 % (04/21/12 1304)    Most Recent Lab Results   Blood Type  No results found for this basename: aborh,  abs   CBC  Lab Results   Component Value Date    WBC 8.7 04/21/2012    HCT 40 04/21/2012    PLT 300 04/21/2012   Chem-7  Lab Results   Component Value Date    NA 144 04/21/2012    K 4.6 04/21/2012    CL 106 04/21/2012    CO2 27 04/21/2012    UN 19 04/21/2012    CREAT 0.85 04/21/2012    GLU 115* 04/21/2012   Estimated Creatinine Clearance: 63.9 ml/min (based on Cr of 0.85).  Electrolytes  Lab Results   Component Value Date    CA 9.2 04/21/2012   Coags  Lab Results   Component Value Date    PTI 10.3 04/21/2012    INR 1.0 04/21/2012    PTT 28.1 04/21/2012   LFTs  No results found for this basename: AST,  ALT,  ALK     Bilirubin,Ur   Date Value Range Status   04/15/2011 Negative  Negative Final     Pregnancy Status: Postmenopausal [2]  No LMP recorded. Patient is postmenopausal.    No results found for this basename: PUPT,  UPREG,  SPREG,  HCG1,  BHCG2,  BHCG,  HCGB     ECG Results  No results found for this basename: rate,  PR,  statement     ANES CPM    Radiology: No relevant studies      ________________________________________________________________________  Medical Problems  Patient Active Problem List    Diagnosis Date Noted   . Tibia/fibula fracture 04/21/2012   . Family history of malignant hyperthermia 04/21/2012   . 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 Gross Hematuria 05/13/2009     Created by Conversion       . Osteopenia 06/14/2008     Created by Conversion       . Cataract 05/10/2008     Created by Conversion       . Benign Paroxysmal Positional Vertigo 05/10/2008     Created by Conversion       . Allergic Reaction 11/23/2005     Created by Conversion  [facility] Annotation: Nov 23 2005  2:31PM -  ,  : cold medications make her hyper       . 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/2005     Created by Conversion  [facility] Annotation: Nov 23 2005  2:29PM -  ,  : ham           PreOp/PreProcedure Diagnosis (For more detail see procedural consent)            ORIF ankle  Planned Procedure (For more detail see procedural consent)            ORIF ankle  Plan   ASA Score 2  Anesthetic Plan (general- MH  safe); Induction (RSI); Airway (cuffed ETT); Line ( use current access); Monitoring (standard ASA); Positioning (supine); Pain (per surgical team); PostOp (PACU)    Informed Consent     Risks:          Risks discussed were commensurate with the plan listed above with the following specific points:  N/V, aspiration, sore throat , damage to:(eyes, nerves, teeth), allergic Rx, unexpected serious injury    Anesthetic Consent:         Anesthetic plan and risks discussed with:  patient and spouse    Blood products Consent:        Use of blood products discussed with: patient and spouse and they  Consented to blood products    Plan discussed with:  CRNA      Attending Attestation: The patient or proxy understand and accept the risks and benefits of the anesthesia plan. By accepting this  note, I attest that I have personally performed the history and physical exam and prescribed the anesthetic plan within 48 hours prior to the anesthetic as documented by me above.    Author: Jodi Marble, MD

## 2012-04-21 NOTE — ED Provider Notes (Signed)
History     Chief Complaint   Patient presents with   . Leg Injury     HPI Comments: 67 year old female sent from urgent care with a left tib/fib fracture.  The patient reports slipping on ice and twisting her left ankle.  She was splinted at urgent care and sent with copies of her xrays.  She denies significant pain now and has no other complaints.      History provided by:  Patient      History reviewed. No pertinent past medical history.         Past Surgical History   Procedure Laterality Date   . Converted procedure       Filleted Finger Flap Conversion Data        Family History   Problem Relation Age of Onset   . Conversion Other      20071002^Breast Cancer^V16.3^Active^   . Conversion Other      20100415^Atrial Fibrillation^427.31^Active^   . Conversion Other      20100415^Carcinoma Of The Gallbladder^^Active^   . Conversion Other      20100415^Hypertension^401.9^Active^   . Conversion Other      20100415^Adult Sleep Apnea^780.57^Active^   . Conversion Other      16109604^[id number] Hyperthermia Due To Anesthesia^^Active^         Social History      reports that she has never smoked. She has never used smokeless tobacco. His alcohol, drug, and sexual activity histories not on file.    Living Situation    Questions Responses    Patient lives with     Homeless     Caregiver for other family member     External Services     Employment     Domestic Violence Risk           Review of Systems   Review of Systems   Constitutional: Negative for fever.   HENT: Negative for neck pain.    Eyes: Negative for pain.   Respiratory: Negative for chest tightness.    Cardiovascular: Negative for chest pain.   Gastrointestinal: Negative for abdominal pain.   Genitourinary: Negative for flank pain.   Musculoskeletal: Positive for joint swelling.   Skin: Negative for color change.   Neurological: Negative for facial asymmetry.   Psychiatric/Behavioral: Negative for agitation.       Physical Exam     ED Triage Vitals   BP Heart  Rate Heart Rate(via Pulse Ox) Resp Temp Temp Source SpO2 O2 Device O2 Flow Rate   04/21/12 1119 04/21/12 1119 -- 04/21/12 1119 04/21/12 1119 04/21/12 1119 04/21/12 1119 04/21/12 1119 --   174/89 mmHg 88  16 36 C (96.8 F) TEMPORAL 99 % None (Room air)       Weight           04/21/12 1119           68.947 kg (152 lb)               Physical Exam   Nursing note and vitals reviewed.  Constitutional: She is oriented to person, place, and time. She appears well-developed and well-nourished. No distress.   HENT:   Head: Normocephalic and atraumatic.   Eyes: Conjunctivae and EOM are normal. Pupils are equal, round, and reactive to light.   Neck: Normal range of motion. Neck supple. No tracheal deviation present.   Cardiovascular: Normal rate.    Pulmonary/Chest: Effort normal. No stridor. No respiratory distress.   Abdominal: Soft.  She exhibits no distension.   Musculoskeletal: Normal range of motion.   Left ankle swollen with medial and lateral malleoli tenderness.  Normal cap refill in toes.   Neurological: She is alert and oriented to person, place, and time. She has normal reflexes.   Skin: Skin is warm and dry. She is not diaphoretic.   Psychiatric: She has a normal mood and affect. Her behavior is normal. Judgment and thought content normal.       Medical Decision Making   <EDMDM>    Initial Evaluation:  ED First Provider Contact    Date/Time Event User Comments    04/21/12 1119 ED Provider First Contact DOYLE, MARGARET Initial Face to Face Provider Contact          Patient seen by me today 04/21/2012 at 1124    Assessment:  67 y.o., female comes to the ED with ankle fracture    Differential Diagnosis includes tib fib fracture, sprain              Plan: ice, elevation, review xray, ortho consult. Analgesia prn.      Elliot Gault, MD    Elliot Gault, MD  04/21/12 [phone]

## 2012-04-21 NOTE — ED Notes (Signed)
Bed:AC-44L<BR> Expected date:<BR> Expected time:<BR> Means of arrival:<BR> Comments:<BR>

## 2012-04-21 NOTE — Telephone Encounter (Signed)
Pt was advised to take her xray results and go to the ED. BGT

## 2012-04-21 NOTE — ED Notes (Signed)
Pt sent from Urgent care with left Tib/Fib Fx. Pt states she slipped and fell on ice this am. Left leg splinted, pt denies any other injuries.

## 2012-04-21 NOTE — Telephone Encounter (Signed)
Pt's husband called stating the patient fell in a snow bank this morning  Patient went to [facility] and was diagnosed with a dislocated left ankle along with broken bones.  Their recommended immediate surgery  Please call the patient as soon as possible   [phone]

## 2012-04-22 LAB — BASIC METABOLIC PANEL
Anion Gap: 8 (ref 7–16)
CO2: 26 mmol/L (ref 20–28)
Calcium: 8.9 mg/dL (ref 8.6–10.2)
Chloride: 106 mmol/L (ref 96–108)
Creatinine: 0.79 mg/dL (ref 0.51–0.95)
GFR,Black: 89 *
GFR,Caucasian: 77 *
Glucose: 131 mg/dL — ABNORMAL HIGH (ref 60–99)
Lab: 14 mg/dL (ref 6–20)
Potassium: 4.6 mmol/L (ref 3.3–5.1)
Sodium: 140 mmol/L (ref 133–145)

## 2012-04-22 LAB — HEMATOCRIT: Hematocrit: 37 % (ref 34–45)

## 2012-04-22 MED ORDER — SENNOSIDES 8.6 MG PO TABS *I*
2.0000 | ORAL_TABLET | Freq: Every day | ORAL | Status: DC
Start: 2012-04-22 — End: 2012-06-16

## 2012-04-22 MED ORDER — OXYCODONE-ACETAMINOPHEN 5-325 MG PO TABS *I*
1.0000 | ORAL_TABLET | ORAL | Status: DC | PRN
Start: 2012-04-22 — End: 2012-06-16

## 2012-04-22 MED ORDER — CEFAZOLIN SODIUM 1000 MG IJ SOLR *I*
INTRAMUSCULAR | Status: AC
Start: 2012-04-22 — End: 2012-04-22
  Filled 2012-04-22: qty 10

## 2012-04-22 MED ORDER — KETOROLAC TROMETHAMINE 30 MG/ML IJ SOLN *I*
15.0000 mg | Freq: Once | INTRAMUSCULAR | Status: AC
Start: 2012-04-22 — End: 2012-04-22

## 2012-04-22 MED ORDER — KETOROLAC TROMETHAMINE 30 MG/ML IJ SOLN *I*
INTRAMUSCULAR | Status: AC
Start: 2012-04-22 — End: 2012-04-22
  Administered 2012-04-22: 15 mg via INTRAVENOUS
  Filled 2012-04-22: qty 1

## 2012-04-22 MED ORDER — DOCUSATE SODIUM 100 MG PO CAPS *I*
ORAL_CAPSULE | ORAL | Status: AC
Start: 2012-04-22 — End: 2012-04-22
  Administered 2012-04-22: 200 mg via ORAL
  Filled 2012-04-22: qty 2

## 2012-04-22 MED ORDER — ENOXAPARIN SODIUM 40 MG/0.4ML IJ SOSY *I*
PREFILLED_SYRINGE | INTRAMUSCULAR | Status: AC
Start: 2012-04-22 — End: 2012-04-22
  Administered 2012-04-22: 40 mg via SUBCUTANEOUS
  Filled 2012-04-22: qty 0.4

## 2012-04-22 MED ORDER — SENNOSIDES 8.6 MG PO TABS *I*
ORAL_TABLET | ORAL | Status: AC
Start: 2012-04-22 — End: 2012-04-22
  Administered 2012-04-22: 2 via ORAL
  Filled 2012-04-22: qty 2

## 2012-04-22 MED ORDER — DOCUSATE SODIUM 100 MG PO CAPS *I*
200.0000 mg | ORAL_CAPSULE | Freq: Every evening | ORAL | Status: DC
Start: 2012-04-22 — End: 2012-06-16

## 2012-04-22 NOTE — Plan of Care (Signed)
Problem: Inability to Tolerate OOB Activities  Goal: STG - TOLERATE OOB ACTIVITIES  Outcome: Completed or Resolved Date Met:  04/22/12  STG: Patient will tolerate out of bed functional mobility and demonstrate independence with transfers and ambulation on level surfaces maintaining NWB RLE and using rolling walker.      Time Frame: 1 Visit

## 2012-04-22 NOTE — Progress Notes (Signed)
Pt OOB several times ambulated to bathroom using front wheel walker does well but needs reminder to slow down. Voiding very frequently clear yellow urine, amounts as documented on flow sheets. Pt bladder scanned at 0242 for 539 cc, discussed straight cath at this time but pt wanted to attempt to empty on her own. After voiding 2 more times 250 -300 cc amounts repeat bladder scan showed 439 cc remaining in her bladder. Pt straight cathed at 0430 for 550 cc urine using 14 fr straight cath using sterile technique. Pt on continuous pulse oximetry through the night with her heart rate abruptly rising to 120's -130's on several occasions for approx 5 sec or less and then returns as abruptly to her baseline 70's to 80's. Pt denies any chest pain or palpitations. Pt does report that she felt "wired" and that she sometimes reacts to some medications in that way.  Dr Janace Litten notified at [phone] with no change in care or orders. Taking po fluids without nausea. IV changed to saline lock. Otilio Connors, RN

## 2012-04-22 NOTE — Progress Notes (Signed)
OOB and ambulating using rolling walker. Walker delivered and patient took walker home with her. Tolerating a regular diet. After second void this morning states she feels as though she is able to empty her bladder. Physical therapy in and cleared patient for discharge. Discharge instructions reviewed and understanding verbalized.Ready for discharge.

## 2012-04-22 NOTE — Progress Notes (Signed)
Physical Therapy Initial Evaluation:    History of Present Admission:  67 y.o. female admitted on 04/21/2012 after fall on the ice,  now s/p ORIF L ankle on 04/21/12.      Activity Order: as tolerated     Weight Bearing Status:  NWB RLE    History reviewed. No pertinent past medical history.    Past Surgical History   Procedure Laterality Date   . Converted procedure       Filleted Finger Flap Conversion Data           04/22/12 1100   Prior Living    Prior Living Situation Reported by patient   Lives With Spouse   Receives Help From Independent   Vocational Retired   Type of Home 2 Story home   # Steps to [employer] 10  (Indoor stairs from garage to first floor)   # Rails to [employer] 1   # Of Steps In Home 3   # Rails in Home 1   Location of Bedrooms 1st floor   Location of Bathrooms 1st floor   Additional Comments Split level home, stairs to enter from garage to kitchen, living room, family room level.  Then, goes up 3 stairs to the bathroom, bedroom.     Prior Function Level   Prior Function Level Reported by patient   Transfers Independent   Walking Independent;Community distances   Stair negotiation Independent   Additional Comments Patient attends regular exercise classes   PT Tracking   PT TRACKING PT Discontinue   Visit Number   Visit Number 1   Precautions/Observations   Precautions used x   Weight Bearing Status RLE NWB   Brace Applied X   Brace Type Other (comment)  (RLE short leg posterior splint and ace wrap intace.  )   LDA Observation None   Fall Precautions General falls precautions   Pain Assessment   *Is the patient currently in pain? X   Pain (Before,During, After) Therapy Before   0-10 Scale 4   Pain Location Ankle   Pain Orientation Right   Pain Descriptors Aching   Pain Intervention(s) Refer to nursing for pain management;Repositioned;Cold applied   Vision    Current Vision No visual deficits   Cognition   Cognition No deficit noted   UE Assessment   UE Assessment Full range RUE AROM;Full range  LUE AROM   LUE Strength   Overall Strength WNL strength 5/5   RUE Strength   Overall Strength WNL strength 5/5   LE Assessment   LE Assessment Full AROM LLE;Impaired AROM RLE  [name]; [name]   Sensation   Sensation No apparent deficit   Bed Mobility   Bed mobility x   Supine to Sit Independent;Head of bed elevated   Sit to Supine Independent;Head of bed elevated   Transfers   Transfers x   Sit to Stand Modified independent (device)   Stand to sit 1 person assist;Modified independent (device)   Transfer Assistive Device rolling walker   Additional comments NWB RLE maintained   Mobility   Mobility X   Weight Bearing Status RLE   Weight Bearing Status RLE RLE NWB   Ambulation Assist Modified independent (device)   Ambulation Distance (Feet) 120   Ambulation Assistive Device rolling walker   Stairs Assistance 1 person assist   Stair Management Technique One rail;With crutches;Forwards   Number of Stairs 8   Additional comments Patient  mobilized well with walker; cues to mobilize at slow pace.  Demonstrated good speed after these cues.  Steady gait cycle without LOB noted.  Patient negotatied stairs slowly and steadily without LOB noted.  Also discussed possibility of the patient sitting down on the stairs and bumping up and down if needed.  She verbalized understanding.       Balance   Balance x   Sitting - Static Independent    Sitting - Dynamic Independent   Standing - Static Supported;Independent   Standing - Dynamic Independent;Supported   Assessment   Brief Assessment Patient demonstrates adequate mobility skills to return home   Patient / Family Goal Go home   Plan/Recommendation   Treatment Interventions No further PT interventions   PT Frequency none further;One time visit   Hospital Stay Recommendations Out of bed with nursing assist;Ambulate daily with nursing assist;May be out of bed with family assist;May ambulate with family assist   Discharge Recommendations Anticipate  return to prior living arrangement   PT Discharge Equipment Recommended Rolling Walker  (Rolling walker ordered from [facility] )   Assessment/Recommendations Reviewed With: Patient;Nursing   Time Calculation   PT Timed Codes 0   PT Untimed Codes 46   PT Unbilled Time 0   PT Total Treatment 46   PT Charges   PT [facility] Charges Eval 45 minutes     Lenox Ahr, PT   Pager [phone]

## 2012-04-22 NOTE — Discharge Instructions (Signed)
DISCHARGE INSTRUCTION SHEET  Foot/Ankle  John Ketz, M.D.  [phone]    Please follow the instructions next to any blank that is checked [x]    Weightbearing on operative leg:  [x] none at all  [] heel only  [] as tolerated  [] use post operative shoe  [x] use crutches or walker as needed    DIET:  Resume your usual diet as tolerated    MEDICATIONS:  Resume your usual medication  If you are taking prescribed pain medication, you should not drive, operate machinery, power tools, or drink any alcoholic beverages  [x] Take one 325 mg aspirin twice daily until instructed to stop by your doctor    DRESSINGS:  [x] Keep your dressing clean and dry until you see your doctor  [] Remove your dressing after 72 hours and replace as needed    SPECIAL INSTRUCTIONS:  You may take a bath/shower--KEEP DRESSING/WOUND DRY  You may not drive until instructed to do so by your doctor    SURGERY TO A LIMB:  Keep limb elevated on 2-3 pillows so that it is above the level of your heart for 72 hours  Wiggle toes of affected limb  When you are not transferring, keep leg elevated as much as possible    YOU SHOULD CALL YOUR DOCTOR at [phone] FOR ANY OF THE FOLLOWING:  Fever of 101° or higher  Redness, warmth and firmness around incision  Foul smelling drainage from incision or cast  Pain that does not lessen with pain medication as prescribed by your doctor  Persistent nausea or vomiting into the next day  Bleeding or continuous oozing that saturates the bandage that does not stop after  applying pressure to wound for 10 minutes  Increased swelling of toes or severe tightness of bandage not relieved with elevation of limb above the level of your heart  Increased numbness or tingling  Pale, blue or cold toes/nail beds (compared to opposite side)    You may return to work/school on--to be determined.      Follow-up care: Call [phone] for a follow up appointment.

## 2012-04-22 NOTE — Progress Notes (Addendum)
Patient:[name]  MRN: [id number]  DOA: 04/21/2012  Ortho Progress Note for 04/22/2012    Events:   NAE. Pain well controlled. Tolerating PO.       Vitals:  Filed Vitals:    04/22/12 0340   BP: 134/62   Pulse: 84   Temp: 36.6 C (97.9 F)   Resp: 16   Height:    Weight:        Intake/Output:       Recent Labs:    Recent Labs  Lab 04/22/12  0434 04/21/12  1523   WBC  --  8.7   Hematocrit 37 40   Creatinine 0.79 0.85   INR  --  1.0   Platelets  --  300   Potassium 4.6 4.6       Physical Exam:  NAD, AAOx3  LLE:  Splint c/d/i  Motor: Able to actively dorsiflex and plantarflex at the ankle, actively flexes and extends all toes   Sensory: SILT med/lat thigh, med/lat leg, and medial/lateral/dorsal/plantar surfaces of foot including first web space  all digits wwp, cap refill <3secs      Assessment and Plan:  67 y.o. female admitted on 04/21/2012 now s/p ORIF L ankle on 04/21/12    -Analgesia: percocet  -Diet: regular  -DVT Prophylaxis: lovenox, will DC on ASA 325mg  PO daily   -Weight-bearing status: NWB LLE  -PT/OT/OOB  -Abx ancef post op  -Dispo: likely home today    Nira Conn, MD, Orthopaedics Resident  04/22/2012  7:02 AM    I saw and evaluated the patient. I agree with the resident's/fellow's findings and plan of care as documented above.    Jerolyn Center, MD

## 2012-04-23 NOTE — Op Note (Signed)
[name], [name] MR #:  [id number]   ACCOUNT #:  [id number] DOB:  [birth year]    AGE:  67     SURGEON:  Lamar Sprinkles, MD  CO-SURGEON:    ASSISTANT:  Genia Hotter, MD,RES, and Gabrielle Dare, MD,RES  SURGERY DATE:  04/21/2012    PREOPERATIVE DIAGNOSIS:    1. Left trimalleolar ankle fracture-dislocation, 824.6.   2. Left distal tibiofibular syndesmotic instability, 845.23.    POSTOPERATIVE DIAGNOSIS:    1. Left trimalleolar ankle fracture-dislocation, 824.6.   2. Left distal tibiofibular syndesmotic instability, 845.23.    OPERATIVE PROCEDURE:    1. Open reduction and internal fixation of left trimalleolar ankle fracture-dislocation, CPT 27822.  2. Open reduction and internal fixation of left distal tibiofibular syndesmosis, CPT 27829.    ANESTHESIA:  General.    ESTIMATED BLOOD LOSS:  Minimal.    FLUIDS:  Intravenous crystalloid.    OUTPUTS:  None.    COMPLICATIONS:  None.    INDICATIONS FOR PROCEDURE:  The patient is a 67 year old female who sustained a twisting injury to her left ankle.  She had immediate pain with deformity.  She was found to have a left ankle fracture-dislocation.  She was seen in the emergency room and closed reduced.  She has been cleared for operative stabilization of her unstable injury.    DESCRIPTION OF PROCEDURE:  The patient was seen in the preanesthesia unit.  The left lower extremity was confirmed as the correct operative site.  The patient was brought back to the operating room and placed supine on the operating room table.  She was administered general anesthesia by the anesthesiology team.  A tourniquet was placed high on her left thigh, and her left lower extremity was then prepped and draped in the usual sterile fashion.     The leg was elevated and exsanguinated, and the tourniquet was inflated.  A standard lateral based approach was taken to the distal fibula.  Sharp dissection was carried down skin.  Blunt dissection was carried down subcutaneous tissues, down to the  level of the fracture.  Fracture hematoma was thoroughly irrigated.  There was comminution noted as well as some osteopenia noted of the bone.  At this point, this was reduced and it was felt to be acceptable alignment, and a 7-hole, one-third tubular plate was selected and placed across the fibula.  It was held proximally with two 3.5 mm cortical screws and distally with two 4.0 cancellous screws.  A single 3.5 mm cortical lag screw was placed across the fracture and adequate stability was achieved.     Following fixation of the fibula, under fluoroscopic imaging, there was satisfactory alignment of the medial clear space and of the posterior malleolar fracture.  The posterior malleolar fracture was felt to be in adequate position and did not require stabilization.     At this point, under fluoroscopic guidance, the joint was tested with clamps using a Cotton testing as well as external rotation stressing.  There was gross instability of the syndesmosis.  At this point, the syndesmosis was reduced and held into anatomical alignment, and a 3.5 mm cortical screw was placed through the plate, across the syndesmosis.  Adequate placement of the screw was confirmed and there was no continued instability of the syndesmosis.     The wound was copiously irrigated with normal saline.  Deep tissues closed with #1 Vicryl suture, subcutaneous tissue closed with 2-0 Vicryl suture, and the skin was closed with interrupted  3-0 nylon stitches.  Xeroform, dry sterile dressing were placed over the wound.  The tourniquet was deflated, and the patient was taken to the postanesthesia care unit in stable condition.             ______________________________  Lamar Sprinkles, MD    JPK/MODL  DD:  04/23/2012 08:43:21  DT:  04/23/2012 12:02:22  Job #:  [id number]    cc: Lamar Sprinkles, MD

## 2012-04-24 MED FILL — Bupivacaine HCl Preservative Free (PF) Inj 0.5%: INTRAMUSCULAR | Qty: 30 | Status: AC

## 2012-04-25 MED FILL — Midazolam HCl Inj 2 MG/2ML (Base Equivalent): INTRAMUSCULAR | Qty: 4 | Status: AC

## 2012-04-25 MED FILL — Bupivacaine HCl Preservative Free (PF) Inj 0.5%: INTRAMUSCULAR | Qty: 30 | Status: AC

## 2012-04-25 MED FILL — Fentanyl Citrate Inj 0.05 MG/ML: INTRAMUSCULAR | Qty: 10 | Status: AC

## 2012-05-02 ENCOUNTER — Encounter: Payer: Self-pay | Admitting: Gastroenterology

## 2012-05-05 ENCOUNTER — Encounter: Payer: Self-pay | Admitting: Physician Assistant

## 2012-05-05 ENCOUNTER — Ambulatory Visit: Payer: Self-pay | Admitting: Physician Assistant

## 2012-05-05 VITALS — BP 134/67 | Ht 60.0 in | Wt 152.0 lb

## 2012-05-05 DIAGNOSIS — S82209A Unspecified fracture of shaft of unspecified tibia, initial encounter for closed fracture: Secondary | ICD-10-CM

## 2012-05-05 DIAGNOSIS — S82409A Unspecified fracture of shaft of unspecified fibula, initial encounter for closed fracture: Secondary | ICD-10-CM

## 2012-05-05 NOTE — Progress Notes (Signed)
Diagnosis: Left distal tibiofibular syndesmosis on 04/21/12    Follow up visit: 2 weeks    X-rays on return: None     History of present illness: [name] presents today for her first postop visit.  She states that she has been progressing well with minimal discomfort.  She states that she has remain strictly nonweightbearing over the last 2 weeks.  She states that she has discontinued taking her pain medication.  She has been taking a daily aspirin.  She otherwise states that she does occasionally experience some numbness and tingling sensations as well as a burning and throbbing sensation as she is not elevating.  She otherwise is pleased with her progress and has no other concerns or questions at today's visit.  She denies fever, chills, night sweats or any drainage from her incision site.    Physical Examination: On physical examination today, the patient was alert and oriented, in no acute distress.  exam of the left lower extremity. Incisions are healing well.  Sutures intact.  Minimal surrounding erythema and moderate amount of swelling noted in the foot and ankle.  She does have tenderness to palpation at her fracture site.  Calf is soft and nontender, no signs of DVT The patient is otherwise neurovascularly  Intact distally of the left lower extremity  Imaging Studies: I personally reviewed 3 views of the left ankle which reveal satisfactory alignment hardware intact no signs of hardware failure.    Assessment/Plan: Ms. Plummer is 2 weeks postop and progressing on course.  At today's visit to to the swelling a slow wound healing we will keep her stitches intact.  She is placed into a short leg nonweightbearing cast today.  She will continue taking a daily aspirin.  She'll followup in 2 weeks for wound check and suture removal.

## 2012-05-19 ENCOUNTER — Encounter: Payer: Self-pay | Admitting: Orthopedic Surgery

## 2012-05-19 ENCOUNTER — Ambulatory Visit: Payer: Self-pay | Admitting: Orthopedic Surgery

## 2012-05-19 VITALS — BP 138/65 | Ht 60.0 in | Wt 152.0 lb

## 2012-05-19 DIAGNOSIS — S82853A Displaced trimalleolar fracture of unspecified lower leg, initial encounter for closed fracture: Secondary | ICD-10-CM

## 2012-05-19 NOTE — Progress Notes (Signed)
PATIENTKOYA, [name] MR #:  [id number]   ACCOUNT #:  [id number] DOB:  [birth year]   DICTATED BY:  Lamar Sprinkles, MD DATE OF VISIT:  05/19/2012     DIAGNOSIS:  Status post ORIF left ankle fracture dislocation.    FOLLOWUP VISIT:  4 weeks.    X-RAYS ON RETURN:  3 views weightbearing left ankle.    HISTORY OF PRESENT ILLNESS:  Ms. [name] returns today.  She states she has been doing well.  She does no discomfort.  She has been doing well, and  she has been pleased with progress.    PHYSICAL EXAMINATION:  On physical examination today, the patient's wounds are well healed.  She has good motion at the ankle without instability.  She is otherwise neurovascularly intact to the left lower extremity.    ASSESSMENT/PLAN:  At this point, the patient is progressing.  I will remove her stitches.  I will see her back in 4 weeks for weightbearing radiographs of her left ankle.  She will switch into a removable boot so she may work on range-of-motion exercises.             ______________________________  Lamar Sprinkles, MD    JPK/MODL  DD:  05/19/2012 11:36:31  DT:  05/19/2012 12:07:14  Job #:  [id number]    cc: Lamar Sprinkles, MD

## 2012-06-14 ENCOUNTER — Telehealth: Payer: Self-pay | Admitting: Orthopedic Surgery

## 2012-06-14 DIAGNOSIS — M25579 Pain in unspecified ankle and joints of unspecified foot: Secondary | ICD-10-CM

## 2012-06-16 ENCOUNTER — Ambulatory Visit: Payer: Self-pay | Admitting: Orthopedic Surgery

## 2012-06-16 ENCOUNTER — Encounter: Payer: Self-pay | Admitting: Orthopedic Surgery

## 2012-06-16 VITALS — BP 152/64 | HR 77

## 2012-06-19 NOTE — Progress Notes (Signed)
PATIENTMIGNON, HUTTO MR #:  [id number]   ACCOUNT #:  [id number] DOB:  [birth year]   DICTATED BY:  Lamar Sprinkles, MD DATE OF VISIT:  06/16/2012     DIAGNOSIS:  Status post open reduction, internal fixation, left ankle fracture dislocation.    FOLLOWUP VISIT:  4 weeks.    X-RAYS ON RETURN:  Weightbearing, 3 views, left ankle.    HISTORY OF PRESENT ILLNESS:  Patient returns for continued followup.  She states she has been doing well.  She has had minimal discomfort.  She is pleased with her progress.    PHYSICAL EXAMINATION:  On physical examination today, the patient is in no acute distress.  The patient has normal mood and affect.  Evaluation of the left extremity reveals minimal swelling.  She has no tenderness over her fracture site.  She is neurovascular exam is at baseline.    ASSESSMENT/PLAN:  At this point, the patient is progressing well.  I have placed her into a removable boot and she will continue working on range of motion exercises. She will continue with daily skin checks.  I will see her back in 4 weeks for repeat radiographs and followup.             ______________________________  Lamar Sprinkles, MD    JPK/MODL  DD:  06/19/2012 16:01:12  DT:  06/19/2012 21:32:18  Job #:  [id number]    cc: Lamar Sprinkles, MD

## 2012-06-27 ENCOUNTER — Telehealth: Payer: Self-pay | Admitting: Primary Care

## 2012-06-27 NOTE — Telephone Encounter (Signed)
Patient called.    Patient broke her left ankle 04/21/12.    Patient saw Dr Drinda Butts (ortho) he then referred her to the physical therapy.    Patient was referred to the wrong PT.  Patient stated she should have been referred to Rometta Emery at [facility]  Dr Drinda Butts office informed the patient that her pcp will need to refer her to Chirsty Bartmess  Please advise  [phone]

## 2012-06-27 NOTE — Telephone Encounter (Signed)
Referral requested

## 2012-06-28 NOTE — Telephone Encounter (Signed)
Referral to Dr. Derrek Monaco at [facility].  MR [id number]  05/06-12/31/14  20 Visits    Left message for patient and faxed referral info to [facility].

## 2012-07-12 ENCOUNTER — Telehealth: Payer: Self-pay | Admitting: Orthopedic Surgery

## 2012-07-12 DIAGNOSIS — M25579 Pain in unspecified ankle and joints of unspecified foot: Secondary | ICD-10-CM

## 2012-07-14 ENCOUNTER — Encounter: Payer: Self-pay | Admitting: Orthopedic Surgery

## 2012-07-14 ENCOUNTER — Ambulatory Visit: Payer: Self-pay | Admitting: Orthopedic Surgery

## 2012-07-14 ENCOUNTER — Ambulatory Visit: Payer: Self-pay

## 2012-07-14 VITALS — BP 144/67 | Ht 60.0 in | Wt 157.0 lb

## 2012-07-14 DIAGNOSIS — S82853A Displaced trimalleolar fracture of unspecified lower leg, initial encounter for closed fracture: Secondary | ICD-10-CM

## 2012-07-18 NOTE — Progress Notes (Signed)
PATIENTBERNEDETTE, [name] MR #:  [id number]   ACCOUNT #:  [id number] DOB:  [birth year]   DICTATED BY:  Lamar Sprinkles, MD DATE OF VISIT:  07/14/2012     DIAGNOSIS:  Status post ORIF left ankle fracture dislocation.    FOLLOWUP VISIT:  Two months.    X-RAYS ON RETURN:  Weightbearing 3 views left ankle.    HISTORY OF PRESENT ILLNESS:  Ms. [name] returns today.  She states she has been doing well.  She has had minimal discomfort, and she has been pleased with her progress.    PHYSICAL EXAMINATION:  On physical examination today, the patient is in no acute distress.  The patient has normal mood and affect.  Evaluation of the left lower extremity reveals some minor swelling.  Her neurovascular exam is at baseline.  She has no evidence of instability.    DIAGNOSTIC IMAGING:  Three views of the left ankle reviewed.  Images reveal satisfactory alignment with interval healing.    ASSESSMENT AND PLAN:  At this point, we have placed the patient into a brace.  I will have her return back to activities tolerated.  She would like to attend formal physical therapy, which I think is reasonable.  See her back in 2 months for repeat radiographs and followup.             ______________________________  Lamar Sprinkles, MD    JPK/MODL  DD:  07/18/2012 15:29:51  DT:  07/18/2012 15:58:08  Job #:  [id number]    cc: Lamar Sprinkles, MD

## 2012-07-25 NOTE — Patient Instructions (Signed)
[facility]      Your physician has determined that you require Prefabricated (off-the-shelf) Orthotic and Prosthetic (O&P) devices and/or Durable Medical Equipment (DME).  O&P devices include items such as braces for the spine or limbs, while DME includes items such as canes, crutches and walkers.  For your convenience you were fitted by the [facility].    Return Policy:    • Prefabricated O&P and DME items are not returnable if used outside of clinic due to hygiene concerns.    • Special or custom orders are not returnable or refundable.    • O&P devices and DME purchased from the [facility] may only be exchanged if the item is faulty or poorly fitting, as determined by the O&P Department Clinical Coordinator.  Exchanges will be made using the same type of device, if within 7 days of the purchase date.    • No exchange will be issued for items that were not used in accordance with the manufacturer’s recommended standard of care.    • Replacement or repair of minor parts could become necessary due to wear.  This may include a charge and an order from you physician may be required.  Aircast Airsport Ankle Brace

## 2012-07-25 NOTE — Progress Notes (Signed)
Walk-in Visit    Patient name: [name]     Pain    07/25/12 1757   PainSc:   0 - No pain       Laterality: left    Device:  Airsport Ankle    Functional Goals: Provide joint stability    ROM settings: Not Applicable    Expected Frequency / Duration of Use:   Per physician orders    Brand: Aircast                 Model: Airsport    Size: medium    Modifications made: Not Applicable    Complexity:   Fitting was routine, requiring little to no adjustments    The following conditions led to need for professional expertise to fit:   Physician preference    Yes Device was inspected for safety/security and found to be functioning properly    Ordered/Delivered: Device Delivered    The patient given verbal instructions.  The following Instructions were reviewed:   Purpose of device  Cleaning / Care of device  Potential Risks / Benefits  Frequency / Duration of use  How to report potential failure / malfunctions

## 2012-09-07 ENCOUNTER — Encounter: Payer: Self-pay | Admitting: Gastroenterology

## 2012-09-14 ENCOUNTER — Encounter: Payer: Self-pay | Admitting: Orthopedic Surgery

## 2012-09-14 ENCOUNTER — Ambulatory Visit: Payer: Self-pay | Admitting: Orthopedic Surgery

## 2012-09-14 VITALS — BP 120/73 | Ht 60.0 in | Wt 155.0 lb

## 2012-09-14 DIAGNOSIS — M25579 Pain in unspecified ankle and joints of unspecified foot: Secondary | ICD-10-CM

## 2012-09-14 NOTE — Progress Notes (Signed)
PATIENTMASHA, [name] MR #:  [id number]   ACCOUNT #:  [id number] DOB:  02/18/1946   DICTATED BY:  Lamar Sprinkles, MD DATE OF VISIT:  09/14/2012     DIAGNOSIS:  Status post ORIF left ankle fracture dislocation.    FOLLOWUP VISIT:  Four months.    X-RAYS ON RETURN:  Weightbearing 3 views left ankle.    HISTORY OF PRESENT ILLNESS:  The patient returns today for continued followup.  She states she has been doing well.  She has had some discomfort going up and down steps as well as with her activities. She continues to do her physical therapy activities, and she notes some discoloration.    PHYSICAL EXAMINATION:  On physical examination today, the patient is in no acute distress.  The patient has a normal mood and affect.  Evaluation of her left extremity reveals minimal swelling.  Her skin incision is well healed.  She has good motion at the ankle.  She does retain some significant stiffness on plantar flexion.  She is otherwise distally neurovascularly intact to her left lower extremity.    DIAGNOSTIC IMAGING:  Three views of the left ankle were reviewed.  Images reveal a healed fracture with no change in indwelling hardware.    ASSESSMENT/PLAN:  At this point, the patient is progressing well. I do think that some of her limitations in terms of steps is due to some weakness as well as some plantar flexion deficits. With respect to her swelling. I do think this is causing some slight discoloration of her foot and should resolve with time.  I will see her back in 4 months for a followup.             ______________________________  Lamar Sprinkles, MD    JPK/MODL  DD:  09/14/2012 13:59:22  DT:  09/14/2012 17:09:04  Job #:  [id number]    cc: Lamar Sprinkles, MD

## 2012-09-14 NOTE — Progress Notes (Signed)
This office note has been dictated.

## 2013-01-15 ENCOUNTER — Other Ambulatory Visit: Payer: Self-pay | Admitting: Orthopedic Surgery

## 2013-01-15 ENCOUNTER — Ambulatory Visit: Payer: Self-pay | Admitting: Orthopedic Surgery

## 2013-01-15 ENCOUNTER — Encounter: Payer: Self-pay | Admitting: Orthopedic Surgery

## 2013-01-15 VITALS — BP 138/63 | Ht 60.0 in | Wt 155.0 lb

## 2013-01-15 DIAGNOSIS — M25579 Pain in unspecified ankle and joints of unspecified foot: Secondary | ICD-10-CM

## 2013-01-15 NOTE — Progress Notes (Signed)
[name], [name] MR #:  [id number]   ACCOUNT #:  [id number] DOB:  [birth year]   DICTATED BY:  Lamar Sprinkles, MD DATE OF VISIT:  01/15/2013     DIAGNOSIS:  Status post ORIF left ankle fracture/dislocation.    FOLLOWUP VISIT:  P.r.n.    HISTORY OF PRESENT ILLNESS:  The patient returns today.  She states she has been doing well.  She has been working hard with physical therapy.  She has no issues and continues to heal well.    PHYSICAL EXAMINATION:  On physical examination, the patient is in no acute distress.  The patient has normal mood and affect.  Evaluation of the left lower extremity reveals good motion.  She has no evidence of instability.  She has no tenderness at her fracture sites and neurovascular exam is at baseline.    DIAGNOSTIC IMAGING:  Three views of left ankle reviewed.  Images reveal a healed fracture without change in indwelling hardware.    ASSESSMENT/PLAN:  At this point, the patient is progressing well.  As she is well healed, I would be happy to follow her on a p.r.n. basis.             ______________________________  Lamar Sprinkles, MD    JPK/MODL  DD:  01/15/2013 12:04:29  DT:  01/15/2013 14:40:07  Job #:  [id number]    cc: Lamar Sprinkles, MD

## 2013-03-22 ENCOUNTER — Encounter: Payer: Self-pay | Admitting: Gastroenterology

## 2013-04-06 ENCOUNTER — Telehealth: Payer: Self-pay | Admitting: Primary Care

## 2013-04-06 DIAGNOSIS — L989 Disorder of the skin and subcutaneous tissue, unspecified: Secondary | ICD-10-CM

## 2013-04-06 NOTE — Telephone Encounter (Signed)
Patient wants to see Dr. Raynald KempAlison Holm at [facility]. Insurance referral requested.

## 2013-04-06 NOTE — Telephone Encounter (Signed)
Ref for derm placed.  She should specify to them helped close the lesion is to her eye-sometimes dermatologists will not remove anything if it is too close to the eye-they often defer to an ophthalmology specialist who does eye lid surgery

## 2013-04-06 NOTE — Telephone Encounter (Signed)
Patient called and request to see a dermatologist. States she has a skin lesion/growth under her left eye that is growing in size. Declined to schedule. Request SB refer her to someone to treat this area. Please contact patient with update.    [facility]- [id number]    Patient- [phone]

## 2013-04-10 ENCOUNTER — Encounter: Payer: Self-pay | Admitting: Gastroenterology

## 2013-04-10 LAB — HM MAMMOGRAPHY

## 2013-04-10 NOTE — Telephone Encounter (Signed)
Referral #, [id number]. Given to patient for scheduling.

## 2013-04-16 ENCOUNTER — Encounter: Payer: Self-pay | Admitting: Primary Care

## 2013-04-20 ENCOUNTER — Encounter: Payer: Self-pay | Admitting: Gastroenterology

## 2013-08-07 ENCOUNTER — Ambulatory Visit: Payer: Self-pay | Admitting: Primary Care

## 2013-08-07 ENCOUNTER — Encounter: Payer: Self-pay | Admitting: Primary Care

## 2013-08-07 VITALS — BP 124/80 | HR 72 | Ht 59.84 in | Wt 153.0 lb

## 2013-08-07 DIAGNOSIS — R21 Rash and other nonspecific skin eruption: Secondary | ICD-10-CM

## 2013-08-07 MED ORDER — CLOBETASOL PROPIONATE 0.05 % EX CREA *I*
TOPICAL_CREAM | Freq: Two times a day (BID) | CUTANEOUS | Status: DC
Start: 2013-08-07 — End: 2014-12-18

## 2013-08-07 MED ORDER — VALACYCLOVIR HCL 1000 MG PO TABS *I*
1000.0000 mg | ORAL_TABLET | Freq: Three times a day (TID) | ORAL | Status: AC
Start: 2013-08-07 — End: 2013-08-14

## 2013-08-07 NOTE — Progress Notes (Signed)
Patient ID: [name] is a 68 y.o. y.o. female.      HPI   Rash: [place]  She did not spend a lot of time outside and was actually mostly inside quilting.  She did go for a couple of short walks.  She did get a couple of bug bites, but feels that they were typical bug bites.  The end of last week she developed cold symptoms including sore throat, fatigue, cough and lightheadedness.  The cough was severe over the weekend and she was taking the prescription cough syrup I prescribed previously.  She rested over the weekend and her symptoms have improved.  Over the weekend she noticed an area of rash on her right upper chest and a reddish area under her left eye.  Both areas are very itchy.  She did not apply anything to them.  The area on her face did get slightly better, but she has not seen much improvement on the rash on her left upper chest.  She denies any discharge from the rash and there is no open wound.  She denies any pain, numbness or tingling associated with the rash.  She denies any fevers.    She denies wheeze, shortness of breath, chest pain, nausea, abdominal pain, changes in bowel habits, joint pain, foot pain and headaches.        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  MEDICATIONS     Current Outpatient Prescriptions on File Prior to Visit   Medication Sig Dispense Refill    docusate sodium (COLACE) 100 MG capsule Take 200 mg by mouth as needed (only taking as needed as of 06-16-12)        oxyCODONE-acetaminophen  (PERCOCET) 5-325 MG per tablet Take 1-2 tablets by mouth as needed for Pain (taking as needed as of 06-16-12)   MDD 10 tablets        senna (SENOKOT) 8.6 MG tablet Take 2 tablets by mouth as needed (patient taking as needed as of 4-25- 14)        aspirin 325 MG tablet Take 325 mg by mouth daily        Calcium Carbonate-Vitamin D (CALCIUM 600 + D) 600-400 MG-UNIT per tablet     0    Ascorbic Acid (VITAMIN C) 500 MG tablet     0    Multiple Vitamin (MULTIVITAMIN) capsule     0     No current facility-administered medications on file prior to visit.         Review of Systems  See hpi      OBJECTIVE     Filed Vitals:    08/07/13 1012   BP: 124/80   Pulse: 72   Height: 1.52 m (4' 11.84")   Weight: 69.4 kg (153 lb)     Body mass index is 30.04 kg/(m^2).    Physical Exam  GENERAL APPEARANCE: Appears stated age, well appearing, NAD  HEENT: no thyromegaly, trachea midline, no cervical or supraclavicular lymphadenopathy, no carotid bruit  LUNGS: Clear to auscultation without wheeze or crackles, unlabored breathing, good air entry bilaterally  HEART: Normal S1,S2 without murmurs  EXTREMITIES: Without clubbing, cyanosis, or edema  SKIN: erythematous hive-like lesion under left eye that is circular and slightly raised, cluster of rash on the right upper chest there is a combination of generalized erythema, papules and vesicles, no open wound or discharge, papules and vesicles are extremely small       ORDERS   No orders of the defined types were placed in this encounter.           ASSESSMENT AND PLAN       Rash  She has 2 areas of rash but most likely are not related.  The erythematous area on her face most likely is a bug bite of some sort  The rash on her right upper chest is concerning for either a poison ivy or related reaction to something [place] was exposed [place] or possibly shingles.  She has been under increased stress and she does not think an exposure [place] is likely  Given the possibility  of the 2 we will try treating with Valtrex 3 times daily for one week in the event that this is shingles and also using topical steroid twice a day.  Over the last couple of days there has been no expansion of the rash so I think a topical steroid at this point would be effective and help control the itch and hopefully help resolve the rash quicker  To monitor the area closely for signs of infection, but at this point I do not think this will occur    Call if no improvement

## 2013-08-07 NOTE — Progress Notes (Signed)
error 

## 2014-02-07 ENCOUNTER — Ambulatory Visit: Payer: Self-pay | Admitting: Primary Care

## 2014-02-07 ENCOUNTER — Encounter: Payer: Self-pay | Admitting: Primary Care

## 2014-02-07 VITALS — BP 124/78 | HR 62 | Ht 59.84 in | Wt 153.0 lb

## 2014-02-07 DIAGNOSIS — J069 Acute upper respiratory infection, unspecified: Secondary | ICD-10-CM

## 2014-02-07 MED ORDER — MOXIFLOXACIN HCL 400 MG PO TABS *I*
400.0000 mg | ORAL_TABLET | Freq: Every day | ORAL | Status: DC
Start: 2014-02-07 — End: 2014-07-17

## 2014-02-07 NOTE — Progress Notes (Signed)
Patient ID: [name] is a 68 y.o. y.o. female.      HPI   Cold symptoms: For the past week she has been experiencing cold symptoms.  She states fatigue, shaky feeling, sore throat, nasal congestion, postnasal drip, dry cough, mild shortness of breath with activity and some headaches.  She denies any known fevers, sinus pain, ear pain, wheezing and nausea.  She did take some Tussionex and it did help.  Her husband was recently diagnosed with pneumonia.  She has not taken any other over-the-counter cold medications because she does not like to take medication in general.        ALLERGY   No Known Allergies (drug, envir, food or latex)    PROBLEM LIST     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    Cataract H26.9    Benign Paroxysmal Positional Vertigo H81.10    Osteopenia M89.9, M94.9    Gross Hematuria R31.0    Benign Polyps Of The Large Intestine D12.6    Tibia/fibula fracture S82.90XA    Family history of malignant hyperthermia Z84.89         Medications reviewed and updated.  MEDICATIONS     Current Outpatient Prescriptions on File Prior to Visit   Medication Sig Dispense Refill    clobetasol (TEMOVATE) 0.05 % cream Apply topically 2 times daily 15 g 0    Calcium Carbonate-Vitamin D (CALCIUM 600 + D) 600-400 MG-UNIT per tablet   0    Ascorbic Acid (VITAMIN C) 500 MG tablet   0    Multiple Vitamin (MULTIVITAMIN) capsule   0     No current facility-administered medications on file prior to visit.         OBJECTIVE     Filed Vitals:    02/07/14 1051   BP: 124/78   Pulse: 62   Height: 1.52 m (4' 11.84")   Weight: 69.4 kg (153 lb)     Body mass index is 30.04 kg/(m^2).    Physical Exam  GENERAL APPEARANCE: Appears stated age, well appearing, NAD  EYES: conjunctiva clear, no icterus  HEENT: bilateral tympanic membranes and ear canals normal, oropharynx with mild erythema, no thyromegaly, trachea midline, no cervical or  supraclavicular lymphadenopathy  LUNGS: Clear to auscultation without wheeze or crackles, unlabored breathing, good air entry bilaterally  HEART: Normal S1,S2 without murmurs  EXTREMITIES: Without clubbing, cyanosis, or edema      ORDERS   No orders of the defined types were placed in this encounter.           ASSESSMENT AND PLAN       URI  Likely viral  Tussionex cough syrup at night  There is some concern because her husband has pneumonia and she could be developing a pneumonia  Increase rest and fluids  Over-the-counter cold medications for symptom relief  I will give her a prescription for an antibiotic and she will use this only if her symptoms worsen or do not improve over the next few days.  She will call with any questions or concerns

## 2014-07-09 ENCOUNTER — Telehealth: Payer: Self-pay | Admitting: Primary Care

## 2014-07-09 DIAGNOSIS — Z0000 Encounter for general adult medical examination without abnormal findings: Secondary | ICD-10-CM

## 2014-07-09 NOTE — Telephone Encounter (Signed)
Labs ordered - she needs to fast for 12 hrs

## 2014-07-09 NOTE — Telephone Encounter (Signed)
Patient is requesting labs for her physical on 07/17/2014, please call.    [phone]

## 2014-07-09 NOTE — Telephone Encounter (Signed)
lmor for pt with info

## 2014-07-12 ENCOUNTER — Ambulatory Visit
Admit: 2014-07-12 | Discharge: 2014-07-12 | Disposition: A | Discharge status: Home / Self Care | Payer: Self-pay | Source: Ambulatory Visit | Attending: Primary Care | Admitting: Primary Care

## 2014-07-12 DIAGNOSIS — Z0000 Encounter for general adult medical examination without abnormal findings: Secondary | ICD-10-CM

## 2014-07-12 LAB — CBC AND DIFFERENTIAL
Baso # K/uL: 0 10*3/uL (ref 0.0–0.1)
Basophil %: 0.8 %
Eos # K/uL: 0.1 10*3/uL (ref 0.0–0.4)
Eosinophil %: 1.8 %
Hematocrit: 42 % (ref 34–45)
Hemoglobin: 13.9 g/dL (ref 11.2–15.7)
IMM Granulocytes #: 0 10*3/uL (ref 0.0–0.1)
IMM Granulocytes: 0.2 %
Lymph # K/uL: 1.5 10*3/uL (ref 1.2–3.7)
Lymphocyte %: 29.5 %
MCH: 31 pg/cell (ref 26–32)
MCHC: 33 g/dL (ref 32–36)
MCV: 93 fL (ref 79–95)
Mono # K/uL: 0.5 10*3/uL (ref 0.2–0.9)
Monocyte %: 9.4 %
Neut # K/uL: 3 10*3/uL (ref 1.6–6.1)
Nucl RBC # K/uL: 0 10*3/uL (ref 0.0–0.0)
Nucl RBC %: 0 /100 WBC (ref 0.0–0.2)
Platelets: 318 10*3/uL (ref 160–370)
RBC: 4.5 MIL/uL (ref 3.9–5.2)
RDW: 13.3 % (ref 11.7–14.4)
Seg Neut %: 58.3 %
WBC: 5.1 10*3/uL (ref 4.0–10.0)

## 2014-07-12 LAB — LIPID PANEL
Chol/HDL Ratio: 2.4
Cholesterol: 201 mg/dL — AB
HDL: 85 mg/dL
LDL Calculated: 96 mg/dL
Non HDL Cholesterol: 116 mg/dL
Triglycerides: 102 mg/dL

## 2014-07-12 LAB — COMPREHENSIVE METABOLIC PANEL
ALT: 22 U/L (ref 0–35)
AST: 22 U/L (ref 0–35)
Albumin: 4.6 g/dL (ref 3.5–5.2)
Alk Phos: 81 U/L (ref 35–105)
Anion Gap: 12 (ref 7–16)
Bilirubin,Total: 0.4 mg/dL (ref 0.0–1.2)
CO2: 29 mmol/L — ABNORMAL HIGH (ref 20–28)
Calcium: 8.9 mg/dL (ref 8.6–10.2)
Chloride: 100 mmol/L (ref 96–108)
Creatinine: 0.96 mg/dL — ABNORMAL HIGH (ref 0.51–0.95)
GFR,Black: 69 *
GFR,Caucasian: 60 *
Glucose: 97 mg/dL (ref 60–99)
Lab: 19 mg/dL (ref 6–20)
Potassium: 5 mmol/L (ref 3.3–5.1)
Sodium: 141 mmol/L (ref 133–145)
Total Protein: 7.1 g/dL (ref 6.3–7.7)

## 2014-07-12 LAB — TSH: TSH: 3.35 u[IU]/mL (ref 0.27–4.20)

## 2014-07-15 ENCOUNTER — Encounter: Payer: Self-pay | Admitting: Gastroenterology

## 2014-07-15 LAB — VITAMIN D
25-OH VIT D2: <4 ng/mL
25-OH VIT D3: 30 ng/mL
25-OH Vit Total: 30 ng/mL (ref 30–60)

## 2014-07-15 LAB — HM MAMMOGRAPHY

## 2014-07-17 ENCOUNTER — Encounter: Payer: Self-pay | Admitting: Primary Care

## 2014-07-17 ENCOUNTER — Ambulatory Visit: Payer: Self-pay | Admitting: Primary Care

## 2014-07-17 VITALS — BP 124/72 | HR 64 | Ht 59.25 in | Wt 154.6 lb

## 2014-07-17 DIAGNOSIS — M8580 Other specified disorders of bone density and structure, unspecified site: Secondary | ICD-10-CM

## 2014-07-17 DIAGNOSIS — M79642 Pain in left hand: Secondary | ICD-10-CM

## 2014-07-17 DIAGNOSIS — Z23 Encounter for immunization: Secondary | ICD-10-CM

## 2014-07-17 DIAGNOSIS — Z139 Encounter for screening, unspecified: Secondary | ICD-10-CM

## 2014-07-17 DIAGNOSIS — Z0000 Encounter for general adult medical examination without abnormal findings: Secondary | ICD-10-CM

## 2014-07-17 DIAGNOSIS — L570 Actinic keratosis: Secondary | ICD-10-CM | POA: Insufficient documentation

## 2014-07-17 LAB — PCMH FALL RISK ASSESSMENT

## 2014-07-17 LAB — PCMH FALL RISK PLAN

## 2014-07-17 MED ORDER — CALCIUM CARBONATE-VITAMIN D 600-400 MG-UNIT PO TABS *WRAPPED*
1.0000 | ORAL_TABLET | Freq: Every day | ORAL | Status: AC
Start: 2014-07-17 — End: ?

## 2014-07-17 MED ORDER — FISH OIL PEARLS 300 MG PO CAPS
ORAL_CAPSULE | ORAL | Status: AC
Start: 2014-07-17 — End: ?

## 2014-07-17 NOTE — H&P (Signed)
History and Physical    HISTORY:  Chief Complaint   Patient presents with    Annual Exam         History of Present Illness:    HPI   She is here for her physical exam.  She denies any major changes in her health since she was here.    Low energy: She is concerned about her low energy.  She wonders if it is related to her weight.  She thinks her energy has become lower in the past several months.  She is currently trying to withdraw from some of her obligations to give herself more time for herself.  She does state that she snores, but she denies her husband ever telling her that she stops breathing at night.  She denies any gasping or choking for air.  She does feel that her sleep is refreshing.    Left thumb pain and difficulty Lane hand flat: She has noticed that she has had increased pain in her left thumb and some in her right thumb.  She is unable to lay her left hand flat because of the thumb been adducted.  She was concerned this will worsen and would like to have this further evaluated.    Memory concerns: She has had some increased memory concerns recently.  She states more difficulty recalling names of people and objects.  She is able to recall them, but it just takes longer.  She denies any difficulty problem solving or getting lost.  At times she does forget while driving if a car is behind her if it was behind her previously or if it was not there when she looked in the [employer] a few minutes ago.  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    Back pain: She has noticed lower back pain when she stands for long period of time.  She wonders if this is related to her exercising more regularly over the past year and decreasing core strength.    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.10    Osteopenia M85.80    Gross Hematuria R31.0    Benign Polyps Of The Large Intestine D12.6    Family history of malignant hyperthermia Z84.89    Actinic keratosis L57.0        Past Medical/Surgical History:   Past Medical History   Diagnosis Date    Tibia/fibula fracture 04/21/2012     Past Surgical History   Procedure Laterality Date    Converted procedure       Filleted Finger Flap     Orif  2014     left ankle         Allergies:  No Known Allergies (drug, envir, food or latex)    Current medications:    Current Outpatient Prescriptions   Medication Sig    Multiple Vitamin (MULTIVITAMIN) capsule     calcium carbonate-vitamin D (CALCIUM 600 + D) 600-400 MG-UNIT per tablet Take 1 tablet by mouth daily    Omega-3 Fatty Acids (FISH OIL PEARLS) 300 MG CAPS 1 daily    clobetasol (TEMOVATE) 0.05 % cream Apply topically 2 times daily  Family History:    Family History   Problem Relation Age of Onset    Conversion Other      20071002^Breast Cancer^V16.3^Active^    Conversion Other      20100415^Atrial Fibrillation^427.31^Active^    Conversion Other      20100415^Carcinoma Of The Gallbladder^^Active^    Conversion Other      20100415^Hypertension^401.9^Active^    Conversion Other      [id number]^Adult Sleep Apnea^780.57^Active^    Conversion Other      [id number]^Malignant Hyperthermia Due To Anesthesia^^Active^    Cancer Mother      gallbladder cancer    High blood pressure Mother     High blood pressure Brother     Cancer Maternal Grandmother     Cancer Maternal Grandfather     Stroke Paternal Grandmother     Heart disease Paternal Grandfather        Social/Occupational History:   History     Social History    Marital Status: Married     Spouse Name: N/A     Number of Children: N/A    Years of Education: N/A     Social History Main Topics    Smoking status: Never Smoker     Smokeless tobacco: Never Used     Alcohol Use: Yes      Comment: occasional    Drug Use: No    Sexual Activity: None     Other Topics Concern    None     Social History Narrative    exercising irregularly         Review of Systems:    ROS   CONSTITUTIONAL: low energy - tires more easily, Appetite good, no fevers, chills or weight loss  EYES: No visual changes, no eye pain  ENT: tinnitus, No hearing difficulties, no ear pain  CV: rare palpitations, No chest pain or peripheral edema  RESPIRATORY: occasional dry cough, No wheezing or dyspnea  GI: No GERD, nausea, abdominal pain, or change in bowel habits, no brbpr  GU: No dysuria, no hematuria  MS: back pain with standing long time, knee pain on occasion, left thumb pain, occasional right thumb pain  SKIN: No rashes, no changes in moles or freckles  NEURO: rare dizziness, rare lightheadedness, occasional numbness in toes - occurs randomly, No headaches, no motor weakness  PSYCH: No depression or anxiety      Vital Signs:   BP 124/72 mmHg   Pulse 64   Ht 1.505 m (4' 11.25")   Wt 70.126 kg (154 lb 9.6 oz)   BMI 30.96 kg/m2      PHYSICAL EXAM:  Physical Exam  GENERAL:  Alert and oriented; no acute distress  HEENT:  Lids/conjunctiva normal, EOMi, ext ear/TM's normal, hearing normal, oropharynx normal, no lymphadenopathy, thyroid normal, no carotid bruit   HEART:  RRR, no murmur  LUNGS:  clear bilaterally  CHEST:  normal  BREASTS:  Normal breast exam bilaterally-no nipple discharge, skin change, lumps or axillary lymphadenopathy bilaterally  BACK:  normal  ABDOMEN:  Soft, nontender, nondistended, no hepatosplenomegaly, no masses  EXTREMITIES: no edema, normal pulses  NEUROLOGIC:  cranial nerves II-XII:  Normal      Assessment:    [name] was seen today for annual exam.    Diagnoses and associated orders for this visit:    Preventative health care    Osteopenia  - DXA Scan; Future    Screening  - Hepatitis C antibody; Future    Immunization due  -  Pneumococcal conjugate vaccine 13-valent    Pain of left  hand  - AMB REFERRAL TO HAND SURGERY (Ortho)    Other Orders  - calcium carbonate-vitamin D (CALCIUM 600 + D) 600-400 MG-UNIT per tablet; Take 1 tablet by mouth daily  - Omega-3 Fatty Acids (FISH OIL PEARLS) 300 MG CAPS; 1 daily         .      Plan:        Physical Exam:  Screening blood work reviewed  Prevnar today  EKG done today also 14 was normal-we will not do one today given her low risk for heart disease  Colonoscopy-she will schedule  Mammogram-done this week  Bone density-ordered, last one was 2010  GYN-she will schedule an appointment  I exams up-to-date  Saw a dermatologist in the past year and had actinic keratoses removed.  She does monitor her own skin  Discussed weight loss and the importance of increasing her exercise.  We both feel this will improve her energy and likely improve her lower back pain  If she does not have improvement in her energy or lower back pain she can call-can consider physical therapy for her lower back pain  She will monitor her memory concerns closely-most likely related to normal aging and increased stress.  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

## 2014-07-24 ENCOUNTER — Encounter: Payer: Self-pay | Admitting: Gastroenterology

## 2014-07-24 ENCOUNTER — Ambulatory Visit
Admit: 2014-07-24 | Discharge: 2014-07-24 | Disposition: A | Discharge status: Home / Self Care | Payer: Self-pay | Source: Ambulatory Visit | Attending: Gastroenterology | Admitting: Gastroenterology

## 2014-07-24 LAB — HM COLONOSCOPY

## 2014-07-26 ENCOUNTER — Encounter: Payer: Self-pay | Admitting: Primary Care

## 2014-07-26 LAB — SURGICAL PATHOLOGY

## 2014-08-02 ENCOUNTER — Encounter: Payer: Self-pay | Admitting: Orthopedic Surgery

## 2014-08-02 ENCOUNTER — Ambulatory Visit: Payer: Self-pay | Admitting: Orthopedic Surgery

## 2014-08-02 VITALS — BP 132/60 | Ht 60.0 in | Wt 153.0 lb

## 2014-08-02 DIAGNOSIS — M79643 Pain in unspecified hand: Secondary | ICD-10-CM

## 2014-08-02 NOTE — Patient Instructions (Signed)
Dear Will Bonnet Billey,    Your physician has determined that you require durable medical equipment (DME) as a part of your treatment.  Knee braces, cast boots, walking boots, crutches, etc. Are DME.  These items offer protection and provide for your safety.  The type and quality of DME has been prescribed for you by your provider.      We cannot determine how much of the cost of this product will be paid by your insurance carrier.  Therefore, you may receive a bill for the outstanding balance.  It is your responsibility to pay whatever fee your insurance carrier does not.    DME Return Policy:     Braces and boots are not returnable if work outside of clinic due to hygiene concerns.   Poorly fitting braces can be exchanged for a correct fit within 1 week if the DME is in excellent condition.   DME that was not dispensed by [facility] will not be accepted.    If DME must be returned, it must be returned to the office that dispensed it.   Special order braces are billed at the time of order and are non-refundable.   Brace parts can be ordered and replaced if they become damaged or worn out.  This may include a charge.    Your type of brace: Other - Thumb-o-prene    Patient Signature: __________________________  08/02/2014

## 2014-08-02 NOTE — Progress Notes (Signed)
CC: Left hand complaint    HPI: The patient is here for the first time.  We appreciate Dr. Lawerance Bach allowing [place] to participate in her care.  The patient has had problems with her left hand.  She actually has problems with both hands but the left is significantly worse.  She denies having significant pain.  It is just that she started recently doing some yoga and she has noticed that she cannot lay her hand flat when doing a plank.  It is difficult to hold her self with that hand.  She has had problems in the past with gripping things and lifting them and at times it causes her to have pain at the base of her thumb.  She denies numbness.    PMH/SH/FH/Meds/Allergies/ROS:  All pertinent positives and negatives of all histories and greater than 10 ROS have been personally reviewed.    Physical Exam:   Visit Vitals    BP 132/60    Ht 1.524 m (5')    Wt 69.4 kg (153 lb)    BMI 29.88 kg/m2     The patient is alert and oriented 3 in no acute distress seated comfortably.  Gait is normal.  With inspection of the left hand the patient has positive shelf sign.  She does have laxity at the MCP.  She has difficulty abducting the thumb.  Positive grind test.  Minimal tenderness to palpation of the basal joint today.  Complete range of motion is present otherwise in the hand and the wrist.  Neurovascular status intact.    Imaging: X-rays  IMPRESSION/FINDINGS: PA, lateral, and oblique views. Severe basal   joint degenerative disease. Mild degenerative changes of the third   and fourth DIP joints. Likely old trauma to the tuft of the second   digit. No acute findings.    Assessment and Diagnosis: Left hand arthritis    Plan: At this time, I discussed with her basal joint arthritis as well as arthritis at the MCP.  The patient is not having any type of significant pain and therefore would not benefit from a steroid injection.  I did discuss with her surgery.  She was given a thumb-o-prine splint.  She can use this when she is doing  yoga.  She will return to see Dr. Shawna Clamp as needed in the future.

## 2014-08-14 ENCOUNTER — Encounter: Payer: Self-pay | Admitting: Primary Care

## 2014-12-18 ENCOUNTER — Ambulatory Visit: Payer: Self-pay | Admitting: Primary Care

## 2014-12-18 ENCOUNTER — Encounter: Payer: Self-pay | Admitting: Primary Care

## 2014-12-18 ENCOUNTER — Encounter: Payer: Self-pay | Admitting: Gastroenterology

## 2014-12-18 VITALS — BP 116/80 | HR 74 | Ht 59.84 in | Wt 157.2 lb

## 2014-12-18 DIAGNOSIS — R059 Cough, unspecified: Secondary | ICD-10-CM

## 2014-12-18 DIAGNOSIS — Z139 Encounter for screening, unspecified: Secondary | ICD-10-CM

## 2014-12-18 DIAGNOSIS — R42 Dizziness and giddiness: Secondary | ICD-10-CM

## 2014-12-18 LAB — COMPREHENSIVE METABOLIC PANEL
ALT: 23 U/L (ref 0–35)
AST: 21 U/L (ref 0–35)
Albumin: 4.4 g/dL (ref 3.5–5.2)
Alk Phos: 86 U/L (ref 35–105)
Anion Gap: 13 (ref 7–16)
Bilirubin,Total: 0.3 mg/dL (ref 0.0–1.2)
CO2: 30 mmol/L — ABNORMAL HIGH (ref 20–28)
Calcium: 8.9 mg/dL (ref 8.6–10.2)
Chloride: 100 mmol/L (ref 96–108)
Creatinine: 0.88 mg/dL (ref 0.51–0.95)
GFR,Black: 77 *
GFR,Caucasian: 66 *
Glucose: 136 mg/dL — ABNORMAL HIGH (ref 60–99)
Lab: 20 mg/dL (ref 6–20)
Potassium: 4.2 mmol/L (ref 3.3–5.1)
Sodium: 143 mmol/L (ref 133–145)
Total Protein: 7 g/dL (ref 6.3–7.7)

## 2014-12-18 LAB — MAGNESIUM: Magnesium: 1.9 mEq/L (ref 1.3–2.1)

## 2014-12-18 NOTE — Patient Instructions (Signed)
1) Please get your blood drawn.    2) If no improvement by next Monday, call or send a MyChart message. The next step would be a Holter monitor.     3) Continue the fluticasone nasal spray    4) If no improvement in cough in 1 to 2 weeks, call or send a MyChart message

## 2014-12-18 NOTE — Progress Notes (Signed)
Progress Note    Reason For Visit:   Chief Complaint   Patient presents with    Dizziness       Subjective:      [name] is 69 y.o. 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     Lightheaded - Present for 3 days. It is constant.  It does not feel like her typical vertigo. She describes it has feeling "woozy". Occasionally, she feels like the room is spinning with lying back. She feels like going from sitting to standing makes it worse. It is not worse with turning her head or with exerting herself. She has nasal congestion and a cough but attributes it to allergies. She has chronic tinnitus. No recent URIs.  No itchy watery eyes.  No ear fullness.No chest pain or palpitations.   No headaches, visual changes, unilateral weakness, slurred speech, or numbness/tingling. No fever, dysuria, or SOB.     Cough - Present for several months. Worse at night. Worse with lying down. She takes a cough drop with some improvement in symptoms. She has also been using fluticasone nasal spray for the last few days with improvement in symptoms. She feels like she has post-nasal drip. No heartburn. No LE edema, orthopnea, or PND.       Medications:     Current Outpatient Prescriptions on File Prior to Visit   Medication Sig Dispense Refill    calcium carbonate-vitamin D (CALCIUM 600 + D) 600-400 MG-UNIT per tablet Take 1 tablet by mouth daily 60 tablet 0    Omega-3 Fatty Acids (FISH OIL PEARLS) 300 MG CAPS 1 daily 60 capsule     Multiple Vitamin (MULTIVITAMIN) capsule   0     No current facility-administered medications on file prior to visit.        Medications were reviewed and reconciled. Appropriate changes were made.    Allergies:   No Known Allergies (drug, envir, food or latex)    Allergies were reviewed with patient.  Review of Systems:   ROS. See HPI    Physical Exam:     Vitals:    12/18/14 1136 12/18/14 1140 12/18/14 1142 12/18/14 1144   BP: 120/84 122/84  118/84 116/80   BP Location: Left arm Left arm Left arm Left arm   Patient Position: Sitting Supine Sitting Standing   Cuff Size: adult adult adult adult   Pulse: 66 70 67 74   SpO2: 96%      Weight: 71.3 kg (157 lb 3.2 oz)      Height: 1.52 m (4' 11.84")        Wt Readings from Last 3 Encounters:   12/18/14 71.3 kg (157 lb 3.2 oz)   08/02/14 69.4 kg (153 lb)   07/17/14 70.1 kg (154 lb 9.6 oz)     BP Readings from Last 3 Encounters:   12/18/14 116/80   08/02/14 132/60   07/17/14 124/72       General: Appears stated age. In no apparent distress. Talking in full sentences.   Head: NCAT  Eyes: No conjunctivitis or drainage. No scleral icterus.   Ears: Right Ear: No erythema of ear canal. Light reflex intact. No bulging or erythema. Left Ear:  No erythema of ear canal. Light reflex intact. No bulging or erythema.  Sinuses: No maxillary or frontal sinus tenderness.  Nose: Mild swelling of nasal turbinates. No purulent drainage.   Throat: MMM. No erythema or  oral exudates. Uvula midline.   Lymph: No pre-auricular, post auricular, cervical, posterior cervical, submandibular, submental, or supraclavicular LAD.  Lungs: Good effort. CTA B/L  CVS: No carotid bruits. Irregular.No murmurs, rubs, or gallops. No LE edema.    Mental Status: Awake. Alert. Answers questions appropriately.   Cranial Nerves: II - XII intact.   Motor: 5 out of 5 in upper and lower extremities bilaterally  Sensation: Sensation intact to light touch in upper and lower extremities bilaterally  Reflexes:  2+ in upper and lower extremities bilaterally  Negative Dix Hallpike b/l.     Orders:     Orders Placed This Encounter   Procedures    Comprehensive metabolic panel    Magnesium    EKG 12 lead       Assessment and Plan:     69 y.o. 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     Lightheaded  Unclear etiology but orthostasis,  bppv, and arrhythmia are higher on my differential  ECG shows sinus rhythm at  63 bpm with PACs and no acute ischemic changes  Dix Hallpike negative but she does feel like the room spins w/ lying back and sitting up  Orthostatic vitals negative   Check CMP w/ Mg to r/u metabolic causes   Continue to monitor   If no improvement by Monday, she will let me know & I will order a 24 hr Holter    Chronic Cough  Likely 2/2 post-nasal drip  No hx of smoking  Symptoms have improved over the last 2 to 3 days w/ the addition of fluticasone  Continue fluticasone nasal spray  If no improvement in 1 to 2 wks, she will let me know & I will check CXR and try a PPI    Follow up prn.     Brayton MarsMark Jamiere Gulas, MD 12/18/2014 7:50 PM  [facility]

## 2014-12-19 LAB — HEPATITIS C ANTIBODY: Hep C Ab: NEGATIVE

## 2015-03-28 ENCOUNTER — Encounter: Payer: Self-pay | Admitting: Primary Care

## 2015-03-28 ENCOUNTER — Ambulatory Visit: Payer: Self-pay | Admitting: Primary Care

## 2015-03-28 VITALS — BP 140/90 | HR 86 | Temp 98.2°F | Ht 59.84 in | Wt 157.0 lb

## 2015-03-28 DIAGNOSIS — J069 Acute upper respiratory infection, unspecified: Secondary | ICD-10-CM

## 2015-03-28 LAB — PCMH FALL RISK PLAN

## 2015-03-28 LAB — PCMH FALL RISK ASSESSMENT

## 2015-03-28 MED ORDER — HYDROCOD POLST-CPM POLST ER 10-8 MG/5ML PO SUER *I*
5.0000 mL | Freq: Every evening | ORAL | 0 refills | Status: DC | PRN
Start: 2015-03-28 — End: 2015-04-24

## 2015-03-28 NOTE — Patient Instructions (Signed)
1) Start Tussionex nightly as needed     Let me know if your symptoms worsen

## 2015-03-28 NOTE — Progress Notes (Signed)
Progress Note    Reason For Visit:   Chief Complaint   Patient presents with    Cough       Subjective:      [name] is 70 y.o. year old female who presents to clinic for a cough.     Cough - Started on Monday. The cough is non-productive. Wednesday,s he woke up w/ a lot of mucous in her throat. The cough is worse with laying down. She took Tussionex last night with improvement in symptoms. She has also been taking Air Born and drinking tea with honey.  + mild nasal congestion, PND, raw throat, mild wheezing,  She denies fevers, chills, sinus tenderness,  ear pain, eye drainage, SOB, rash, or diarrhea. No sick contacts. No recent travel.       Problem List was reviewed in e-record today.     Medications:     Current Outpatient Prescriptions on File Prior to Visit   Medication Sig Dispense Refill    Multiple Vitamins-Minerals (PRESERVISION AREDS 2 PO) Take 1 tablet by mouth 2 times daily      calcium carbonate-vitamin D (CALCIUM 600 + D) 600-400 MG-UNIT per tablet Take 1 tablet by mouth daily 60 tablet 0    Omega-3 Fatty Acids (FISH OIL PEARLS) 300 MG CAPS 1 daily 60 capsule     Multiple Vitamin (MULTIVITAMIN) capsule   0     No current facility-administered medications on file prior to visit.        Medications were reviewed and reconciled today. Appropriate changes were made.    Allergies:   No Known Allergies (drug, envir, food or latex)    Allergies were reviewed with patient today.  Review of Systems:   ROS . See HPI    Physical Exam:     Vitals:    03/28/15 1621   BP: 140/90   BP Location: Right arm   Patient Position: Sitting   Cuff Size: adult   Pulse: 86   Temp: 36.8 C (98.2 F)   TempSrc: Temporal   SpO2: 96%   Weight: 71.2 kg (157 lb)   Height: 1.52 m (4' 11.84")     Wt Readings from Last 3 Encounters:   03/28/15 71.2 kg (157 lb)   12/18/14 71.3 kg (157 lb 3.2 oz)   08/02/14 69.4 kg (153 lb)     BP Readings from Last 3 Encounters:   03/28/15 140/90   12/18/14 116/80   08/02/14 132/60        General: Appears stated age. In no apparent distress. Talking in full sentences.   Head: NCAT  Eyes: No conjunctivitis or drainage. No scleral icterus.   Ears: B/L cerumen impaction  Sinuses: No maxillary or frontal sinus tenderness.  Nose: B/L erythema w/o purulent drainage.   Throat: MMM. No erythema or oral exudates. Uvula midline.   Lymph: No pre-auricular, post auricular, cervical, posterior cervical, submandibular, submental, or supraclavicular LAD.  Lungs: Good effort. CTA B/L  CVS: RRR.       Orders:     Orders Placed This Encounter   Procedures    [facility] FALL RISK ASSESSMENT    [facility] FALL RISK PLAN       Assessment and Plan:     70 y.o. year old female who presents to clinic for a cough.    Cough  Likely secondary to viral sinusitis with postnasal drip  Lungs are clear and she has no sinus tenderness on exam  She has not tolerated  over-the-counter decongestants in the past.  Thus, she is not interested in Mucinex or pseudoephedrine/phenylephrine.  She has not tolerated Flonase in the past.  Thus, she is not interested in an intranasal steroid.  Start Tussionex nightly as needed    If no improvement or worsening in symptoms, she will call the office.       Brayton Mars, MD   03/28/2015 4:56 PM  [facility]

## 2015-04-24 ENCOUNTER — Encounter: Payer: Self-pay | Admitting: Primary Care

## 2015-04-24 ENCOUNTER — Ambulatory Visit: Payer: Self-pay | Admitting: Primary Care

## 2015-04-24 VITALS — BP 118/74 | HR 84 | Temp 97.2°F | Ht 59.84 in | Wt 157.8 lb

## 2015-04-24 DIAGNOSIS — R5383 Other fatigue: Secondary | ICD-10-CM

## 2015-04-24 DIAGNOSIS — J029 Acute pharyngitis, unspecified: Secondary | ICD-10-CM

## 2015-04-24 DIAGNOSIS — R52 Pain, unspecified: Secondary | ICD-10-CM

## 2015-04-24 LAB — POCT AMBULATORY RAPID STREP
Lot #: 161434
Rapid Strep Group A Throat-POC: NEGATIVE

## 2015-04-24 LAB — PCMH FALL RISK PLAN

## 2015-04-24 LAB — PCMH FALL RISK ASSESSMENT

## 2015-04-24 MED ORDER — OSELTAMIVIR PHOSPHATE 75 MG PO CAPS *I*
75.0000 mg | ORAL_CAPSULE | Freq: Two times a day (BID) | ORAL | 0 refills | Status: AC
Start: 2015-04-24 — End: 2015-04-29

## 2015-04-24 NOTE — Progress Notes (Signed)
Progress Note    Reason For Visit:   Chief Complaint   Patient presents with    Other     cold symptoms       Subjective:      [name] is 70 y.o. year old female presents to clinic for fatigue, body-aches, and sore throat.    Fatigue, body-aches, and sore throat - she was seen in clinic on 03/28/15 for 5 days with a nonproductive cough.  She was diagnosed with viral sinusitis with postnasal drip and started on Tussionex nightly as needed. By Last week, she felt a lot better. On Sunday, she had a busy day at church volunteering. Afterwards,she was exhausted. Tuesday, she felt a little better. Wednesday, she developed a sore throat, hoarse voice, fatigue, intermittent body-aches, intermittent subjective fevers, PND, and mild left ear pain that has resolved . She took ibuprofen last night for the body-aches. She has not tried anything else.   She has an occasional non-productive cough. No chills, new sinus congestion, sinus pain, ear drainage, dysphagia, SOB, wheezing, rash, or diarrhea. She goes to the nursing home twice per week but has no known sick contacts. No recent travel hx. Sh did receive her flu shot.     PMHx:  No hx of asthma or COPD  No hx of DM     Soc Hx:  Never smoker.     Problem List was reviewed in e-record today.   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    Cataract H26.9    Benign Paroxysmal Positional Vertigo H81.10    Osteopenia M85.80    Gross Hematuria R31.0    Benign Polyps Of The Large Intestine D12.6    Family history of malignant hyperthermia Z84.89    Actinic keratosis L57.0         Medications:     Current Outpatient Prescriptions on File Prior to Visit   Medication Sig Dispense Refill    Multiple Vitamins-Minerals (PRESERVISION AREDS 2 PO) Take 1 tablet by mouth 2 times daily      calcium carbonate-vitamin D (CALCIUM 600 + D) 600-400 MG-UNIT per tablet Take 1 tablet by mouth daily 60 tablet 0     Omega-3 Fatty Acids (FISH OIL PEARLS) 300 MG CAPS 1 daily 60 capsule     Multiple Vitamin (MULTIVITAMIN) capsule   0     No current facility-administered medications on file prior to visit.        Medications were reviewed and reconciled today. Appropriate changes were made.    Allergies:   No Known Allergies (drug, envir, food or latex)    Allergies were reviewed with patient today.  Review of Systems:   ROS . See HPI    Physical Exam:     Vitals:    04/24/15 0933   BP: 118/74   Pulse: 84   Temp: 36.2 C (97.2 F)   SpO2: 99%   Weight: 71.6 kg (157 lb 12.8 oz)   Height: 1.52 m (4' 11.84")     Wt Readings from Last 3 Encounters:   04/24/15 71.6 kg (157 lb 12.8 oz)   03/28/15 71.2 kg (157 lb)   12/18/14 71.3 kg (157 lb 3.2 oz)     BP Readings from Last 3 Encounters:   04/24/15 118/74   03/28/15 140/90   12/18/14 116/80       General: Appears stated age. In no apparent  distress. Talking in full sentences.   Head: NCAT  Eyes: No conjunctivitis or drainage. No scleral icterus.   Ears: Right Ear: No erythema of ear canal. Light reflex intact. No bulging or erythema. Left Ear:  No erythema of ear canal. Light reflex intact. No bulging or erythema.  Sinuses: No maxillary or frontal sinus tenderness.  Nose: Mild erythema. No purulent drainage.   Throat: MMM. +  Erythema. No oral exudates. Uvula midline.   Lymph: No pre-auricular, post auricular, cervical, posterior cervical, submandibular, submental, or supraclavicular LAD.  Lungs: Good effort. CTA B/L  CVS: RRR.       Orders:     Orders Placed This Encounter   Procedures    Strep A culture, throat    Influenza  A & B/RSV PCR    POCT ambulatory rapid strep    [facility] FALL RISK ASSESSMENT    [facility] FALL RISK PLAN       Assessment and Plan:     70 y.o. year old female presents to clinic for fatigue, body-aches, and sore throat.    Fatigue / Body-aches / Sore throat  Unclear etiology but viral URI (possibly influenza) is highest on my differential  Rapid strep negative. I  will send for culture.  I have been seeing  flu in patients who received the influenza vaccine. She is a candidate for tamiflu given she has < 48 hrs of symptoms and she is at high risk (>78 y/o).  Check Flu/RSV pcr  Start tamiflu 75 mg BID x 5 days  I will give her recommendations tomorrow about continuing tamiflu based on above results.      Follow up prn.     Brayton Mars, MD 04/24/2015 9:54 AM  [facility]

## 2015-04-24 NOTE — Patient Instructions (Signed)
1) Start Tamiflu 75 mg twice per day for 5 days

## 2015-04-25 ENCOUNTER — Telehealth: Payer: Self-pay | Admitting: Primary Care

## 2015-04-25 LAB — INFLUENZA  A & B/RSV PCR: Influenza A&B/RSV PCR: 0

## 2015-04-25 NOTE — Telephone Encounter (Signed)
I spoke to [name]. I told her that her flu/rsv test was negative. She continues to have body-aches, subjective fevers, and a intermittent "raw" throat. She does snore. Given her symptoms, I recommended her completing the full course of tamiflu despite the negative flu/rsv pcr. I will let her know about the strep culture.    Brayton MarsMark Atalya Dano, MD

## 2015-04-26 LAB — STREP A CULTURE, THROAT: Group A Strep Throat Culture: 0

## 2015-05-25 ENCOUNTER — Telehealth: Payer: Self-pay | Admitting: Primary Care

## 2015-05-25 MED ORDER — HYDROCOD POLST-CPM POLST ER 10-8 MG/5ML PO SUER *I*
5.0000 mL | Freq: Two times a day (BID) | ORAL | 0 refills | Status: DC | PRN
Start: 2015-05-25 — End: 2016-03-02

## 2015-05-25 NOTE — Telephone Encounter (Signed)
6pm call from patient's husband.  [place] are in [place].  [name] is having a lot of trouble with coughing. Dry cough.  Gets coughing so hard her face turns red.  Husband tells me that she has had trouble with coughing.  They wonder if there is an allergy that she has to something in the living environment in [place].  Husband tells me that patient has been treated with tussionex.  She has no fever, shortness of breath, chest pain.  Does not feel sick.  Will prescribe tussionex with the understanding that she needs to go to urgent care if she develops any of the symptoms listed above.

## 2015-05-27 NOTE — Telephone Encounter (Signed)
Can you see how she is doing? THanks    Brayton MarsMark Dorothy Landgrebe, MD

## 2015-05-27 NOTE — Telephone Encounter (Signed)
Left pt voicemail to call me back in regards to cough.  NL

## 2015-08-13 ENCOUNTER — Other Ambulatory Visit: Payer: Self-pay | Admitting: Gastroenterology

## 2015-08-13 LAB — HM MAMMOGRAPHY

## 2015-08-21 ENCOUNTER — Encounter: Payer: Self-pay | Admitting: Primary Care

## 2015-12-22 ENCOUNTER — Encounter: Payer: Self-pay | Admitting: Gastroenterology

## 2016-01-13 ENCOUNTER — Encounter: Payer: Self-pay | Admitting: Retina Ophthalmology

## 2016-01-13 ENCOUNTER — Ambulatory Visit: Payer: Self-pay | Admitting: Retina Ophthalmology

## 2016-01-13 DIAGNOSIS — H35361 Drusen (degenerative) of macula, right eye: Secondary | ICD-10-CM | POA: Insufficient documentation

## 2016-01-13 NOTE — Assessment & Plan Note (Signed)
--  no AMD  --no need for Preservision    Cataracts OU  --decrased vision is due to cataracts  --refer for eval and surgery

## 2016-01-13 NOTE — Progress Notes (Signed)
Subjective:   Subjective 01/13/2016   Chief Complaint   Patient presents with    Blurred Vision     HPI     NPV/ referred by [facility] (Donofrio, MD)  (Pt is also a good friend of Merrie RoofDon Grover's wife --recommended she be seen   here for a second opinion)    Pt states she struggles to see clearly over the past year, and was told   new glasses won't help. (MRX --no change)  Pt diagnosed with AMD ~ 1 year ago by [facility].  Pt takes AREDS 2, BID.       Last edited by Blain Paisiloreto, Starasia Sinko, MD on 01/13/2016 11:18 AM.       Current Outpatient Prescriptions:     HYDROcodone/chlorpheniramine polistirex (TUSSIONEX) 10-8 MG/5ML extended release suspension, Take 5 mLs by mouth every 12 hours as needed for Cough   Max daily dose: 10 mLs, Disp: 115 mL, Rfl: 0    Multiple Vitamins-Minerals (PRESERVISION AREDS 2 PO), Take 1 tablet by mouth 2 times daily, Disp: , Rfl:     calcium carbonate-vitamin D (CALCIUM 600 + D) 600-400 MG-UNIT per tablet, Take 1 tablet by mouth daily, Disp: 60 tablet, Rfl: 0    Omega-3 Fatty Acids (FISH OIL PEARLS) 300 MG CAPS, 1 daily, Disp: 60 capsule, Rfl:     Multiple Vitamin (MULTIVITAMIN) capsule, , Disp: , Rfl: 0  Review of patient's allergies indicates no known allergies (drug, envir, food or latex).   No birth history on file.  Past Medical History:   Diagnosis Date    Tibia/fibula fracture 04/21/2012      Past Surgical History:   Procedure Laterality Date    CONVERTED PROCEDURE      Filleted Finger Flap     orif  2014    left ankle      History   Smoking Status    Never Smoker   Smokeless Tobacco    Never Used      History   Alcohol Use    Yes     Comment: occasional      History   Drug Use No      Specialty Problems        Ophthalmology Problems    Cataract        Extramacular Drusen, right eye                  Objective:   Objective   Vitals:    01/13/16 1039   BP: 127/73   Pulse: 77       Base Eye Exam     Visual Acuity (Snellen - Linear)      Right Left   Dist cc 20/40 -2 / +2 20/40 -2 / +2      Dist ph cc NI NI       Correction:  Glasses      Tonometry (Tonopen, 10:49 AM)      Right Left   Pressure 15 16         Pupils      Pupils APD   Right PERRLA None   Left PERRLA None         Visual Fields      Left Right   Result Full Full         Extraocular Movement      Right Left   Result Full, Ortho Full, Ortho         Neuro/Psych     Oriented x3:  Yes    Mood/Affect:  Normal      Dilation     Both eyes:  2.5% Phenylephrine, 1.0% Tropicamide @ 10:49 AM            Slit Lamp and Fundus Exam     External Exam      Right Left    External Normal ocular adnexae, lacrimal gland & drainage, orbits Normal ocular adnexae, lacrimal gland & drainage, orbits      Slit Lamp Exam      Right Left    Lids/Lashes Normal structure & position Normal structure & position    Conjunctiva/Sclera Normal bulbar/palpebral, conjunctiva, sclera Normal bulbar/palpebral, conjunctiva, sclera    Cornea Normal epithelium, stroma, endothelium, tear film Normal epithelium, stroma, endothelium, tear film    Anterior Chamber Clear & deep Clear & deep    Iris Normal shape, size, morphology Normal shape, size, morphology    Lens 1-2+ ASC, 1-2+ NS 1-2+ NS, tr PSC    Vitreous PVD PVD      Fundus Exam      Right Left    Disc Normal size, appearance, nerve fiber layer Normal size, appearance, nerve fiber layer    C/D Ratio 0.3 0.3    Macula Normal Normal    Vessels Normal Normal    Periphery Drusen, Extramacular drusen, PVD PVD, No drusen            Refraction     Wearing Rx      Sphere Cylinder Axis Add   Right -2.00 +0.75 179 +2.00   Left -2.00 +0.50 004 +2.00       Age:  Aug 2016    Type:  PAL                                     Assessment/Plan:   Assessment Extramacular Drusen, right eye  --no AMD  --no need for Preservision    Cataracts OU  --decrased vision is due to cataracts  --refer for eval and surgery

## 2016-01-26 ENCOUNTER — Encounter: Payer: Self-pay | Admitting: Ophthalmology

## 2016-01-26 ENCOUNTER — Ambulatory Visit: Payer: Self-pay | Admitting: Ophthalmology

## 2016-01-26 DIAGNOSIS — H527 Unspecified disorder of refraction: Secondary | ICD-10-CM | POA: Insufficient documentation

## 2016-01-26 DIAGNOSIS — H25813 Combined forms of age-related cataract, bilateral: Secondary | ICD-10-CM

## 2016-01-26 DIAGNOSIS — H35361 Drusen (degenerative) of macula, right eye: Secondary | ICD-10-CM

## 2016-01-26 DIAGNOSIS — H43813 Vitreous degeneration, bilateral: Secondary | ICD-10-CM

## 2016-01-26 NOTE — Progress Notes (Signed)
Outpatient Visit      Patient name: [name]  DOB: 1945-05-15       Age: 70 y.o.  MR#: [id number]    Encounter Date: 01/26/2016    Subjective:      Chief Complaint   Patient presents with    New Patient Visit     Cat Eval     HPI     New Patient Visit    Additional comments: Cat Eval           Comments   [name] is a 70 y.o. female  Cc: Pt states VA's are stable with current Rx. Pt states no new floaters   no flashes no pain. Pt states VA's are disturbed at night by glare and   lights. Pt states this keeps her of the road at night  Hx: Drusen of macula  Eye Meds: AREDS 2, BID       Last edited by Ladora DanielKlein, Reeda Soohoo, MD on 01/26/2016  3:16 PM. (History)        has a current medication list which includes the following prescription(s): hydrocodone/chlorpheniramine polistirex, multiple vitamins-minerals, calcium carbonate-vitamin d, fish oil pearls, and multivitamin.     has No Known Allergies (drug, envir, food or latex).      Past Medical History:   Diagnosis Date    Tibia/fibula fracture 04/21/2012      Past Surgical History:   Procedure Laterality Date    CONVERTED PROCEDURE      Filleted Finger Flap     orif  2014    left ankle        Specialty Problems        Ophthalmology Problems    Cataract        Extramacular Drusen, right eye        PVD (posterior vitreous detachment), bilateral        Refractive error               ROS     Negative for: Constitutional, Gastrointestinal, Neurological, Skin,   Genitourinary, Musculoskeletal, HENT, Endocrine, Cardiovascular,   Respiratory, Psychiatric, Allergic/Imm, Heme/Lymph    Last edited by Elberta LeatherwoodGainey, Lyndsey on 01/26/2016  2:04 PM. (History)         Objective:     Base Eye Exam     Visual Acuity (Snellen - Linear)      Right Left   Dist cc 20/25 -1 20/30 -2   Near cc J2 J2       Correction:  Glasses      Tonometry (Tonopen, 2:21 PM)      Right Left   Pressure 12 13         Pupils      Pupils Dark APD   Right PERRLA 4 None   Left PERRLA 4 None         Visual  Fields      Left Right   Result Full Full         Extraocular Movement      Right Left   Result Full Full         Neuro/Psych     Oriented x3:  Yes    Mood/Affect:  Normal      Dilation     Both eyes:  2.5% Phenylephrine, 1.0% Tropicamide @ 2:21 PM            Additional Tests     Glare Testing (BAT)      Off High  Right 20/25 20/150   Left 20/25 20/200               Slit Lamp and Fundus Exam     External Exam      Right Left    External Normal ocular adnexae, lacrimal gland & drainage, orbits Normal ocular adnexae, lacrimal gland & drainage, orbits      Slit Lamp Exam      Right Left    Lids/Lashes Normal structure & position Normal structure & position    Conjunctiva/Sclera Normal bulbar/palpebral, conjunctiva, sclera Normal bulbar/palpebral, conjunctiva, sclera    Cornea Normal epithelium, stroma, endothelium, tear film Normal epithelium, stroma, endothelium, tear film    Anterior Chamber Clear & deep Clear & deep    Iris Normal shape, size, morphology Normal shape, size, morphology    Lens 1-2+ ASC, 1-2+ NS 1-2+ NS, tr PSC    Vitreous PVD PVD      Fundus Exam      Right Left    Disc Normal size, appearance, nerve fiber layer Normal size, appearance, nerve fiber layer    C/D Ratio 0.3 0.3    Macula Normal Normal    Vessels Normal Normal    Periphery Drusen, Extramacular drusen, PVD PVD, No drusen            Refraction     Wearing Rx      Sphere Cylinder Axis Add   Right -1.75 -0.75 100 +2.00   Left -2.00 -0.50 085 +2.00         Manifest Refraction      Sphere Cylinder Axis Dist Add   Right -1.75 -0.75 100 20/25 NI +2.50   Left -2.50 -0.25 085 20/25 +2.50                                     Assessment/Plan:     1. Combined forms of age-related cataract of both eyes     2. Extramacular Drusen, right eye     3. Refractive error     4. PVD (posterior vitreous detachment), bilateral          PLAN:  1. Visually significant cataracts causing a limitation in activities of daily living such as driving, reading and  adequately performing hobbies.    - Discussed r/b/a to cataract surgery with intraocular lens implantation including loss of vision, loss of eye, retina detachment, cystic macular edema, prolonged healing/iritis, wound leak, infection, need for drops post-operatively, need for glasses post operatively, possible need for subsequent surgery to address any complications.  - The patient has elected to proceed, left eye first  - After a detailed discussion, the patient needs some additional time to consider her post-operative refractive target.    2. Recent exam with Dr. Johnnette Litteriloreto and felt to be extramacular.  Hopefully, this Miranda have little if any impact on her final best vision after cataract removal.    3. Update after #1.  Still deciding on refractive target.    4. Discussed at length signs/sx of retinal detachment.  Instructed to contact this office urgently for new floaters/flashes or shadow in vision.

## 2016-02-03 ENCOUNTER — Ambulatory Visit: Payer: Self-pay

## 2016-02-03 ENCOUNTER — Encounter: Payer: Self-pay | Admitting: Gastroenterology

## 2016-03-02 ENCOUNTER — Encounter: Payer: Self-pay | Admitting: Primary Care

## 2016-03-02 ENCOUNTER — Ambulatory Visit: Payer: Self-pay | Admitting: Primary Care

## 2016-03-02 ENCOUNTER — Encounter: Payer: Self-pay | Admitting: Gastroenterology

## 2016-03-02 VITALS — BP 138/88 | HR 64 | Ht 59.84 in | Wt 138.0 lb

## 2016-03-02 DIAGNOSIS — M8580 Other specified disorders of bone density and structure, unspecified site: Secondary | ICD-10-CM

## 2016-03-02 DIAGNOSIS — H25813 Combined forms of age-related cataract, bilateral: Secondary | ICD-10-CM

## 2016-03-02 DIAGNOSIS — Z01818 Encounter for other preprocedural examination: Secondary | ICD-10-CM

## 2016-03-02 DIAGNOSIS — Z8489 Family history of other specified conditions: Secondary | ICD-10-CM

## 2016-03-02 LAB — PCMH FALL RISK PLAN

## 2016-03-02 LAB — PCMH FALL RISK ASSESSMENT

## 2016-03-02 NOTE — H&P (Signed)
Dear Dr. Graciela Husbands,    I appreciate your asking me to evaluate this patient in regards to her chronic medical conditions as they pertain to her upcoming surgery.  Her chronic medical conditions are:    1. Cataracts - She has bilateral cataracts. She is going to undergo b/l cataract surgery by Dr. Graciela Husbands on 1/23 and 2/6. Her vision is worst at night.     2. Family Hx of Malignant Hyperthermia - Her mom developed a fever after an appendectomy. She required cooling. She never underwent genetic testing. Shelanda had a tonsillectomy as a child w/o issue.     3. Osteopenia  - She is taking calcium / vitamin D. No hx of hip or spine fractures.     What type of Surgery:  Low Risk (cardiac risk <1%)    EXERCISE:  Climb a flight of stairs ; she walks 30 minutes two to three times per week    Ever had stress test: No  Ever had angiogram: No  Ever had ECHO: No     MAJOR PREDICTORS OF INCREASED PERIOPERATIVE RISK:  Unstable Coronary Syndrome - No chest pain at rest or w/ exertion.   Acute Decompensated - No SOB, DOE,  orthopnea, PND,  Or LE edema.   Significant arrhythmia (type II 2nd or 3rd AV block, symptomatic bradycardia, VT, or SVT with rate>100)- No palpitations.   Severe Valvular Disease- No   MI in last 30 days- No  Recent Coronary Artery Stent Placed- No     REVISED CARDIAC RISK INDEX (RCRI) (one point for each):  High Risk Surgical Procedure- No  H/O Ischemic Heart Dz- No   H/O CHF- No   H/O TIA or CVA- No   Preoperative Insulin Therapy- No  Preoperative Cr >1.9 mg/dl. - No     RCRI predicts risk of major cardiac event  Nedra Hai et al.  Circulation 1999; 100:1043)  Score of 0 = 0.4%    H/O cardiac Stent? No  Bare Metal Stent must wait 30 to 45 days.  Drug Eluding Stent must wait 12 month    Ever Had Anesthesia Before: Yes  Any Family history of Anesthesia problems:Yes. Mom w/ malignant hyperthermia.   Ever had a difficult intubation:No     Personal History of abnormal bleeding: No   Family History of abnormal bleeding  No     Do you  Snore loudly: Yes  Do you often feel tired, fatigued, or drowsy during the day?Sometimes.  Observed apnea?No    Chest pain, arm pain, or jaw pain with exertion:No  SOB with exertion:No     Review of Systems   Constitutional: Negative for fever.   HENT: Negative for congestion and sore throat.    Eyes:        + decreased vision at night   Respiratory: Negative for shortness of breath.    Cardiovascular: Negative for chest pain and leg swelling.   Gastrointestinal: Negative for blood in stool.   Genitourinary: Negative for hematuria.       Medications reviewed and confirmed with the patient.  Current Outpatient Prescriptions   Medication Sig Dispense Refill    Multiple Vitamins-Minerals (PRESERVISION AREDS 2 PO) Take 1 tablet by mouth 2 times daily      calcium carbonate-vitamin D (CALCIUM 600 + D) 600-400 MG-UNIT per tablet Take 1 tablet by mouth daily 60 tablet 0    Omega-3 Fatty Acids (FISH OIL PEARLS) 300 MG CAPS 1 daily 60 capsule     Multiple Vitamin (  MULTIVITAMIN) capsule   0    FLUZONE HIGH-DOSE 0.5 ML SUSY injection ADM 0.5ML IM UTD  0    PNEUMOVAX 23 25 MCG/0.5ML injection ADM 0.5ML IM UTD  0     No current facility-administered medications for this visit.         No Known Allergies (drug, envir, food or latex)    Entire chart (including PMH, SH, and FH) was reviewed today with the patient.  Patient Active Problem List    Diagnosis Date Noted    Refractive error 01/26/2016    PVD (posterior vitreous detachment), bilateral 01/26/2016    Extramacular Drusen, right eye 01/13/2016     --no AMD  --no need for Preservision    Cataracts OU  --decrased vision is due to cataracts  --refer for eval and surgery      Actinic keratosis 07/17/2014    Family history of malignant hyperthermia 04/21/2012     Due to anesthesia      Benign Polyps Of The Large Intestine 08/01/2009     Created by Conversion  [facility] Annotation: Aug 04 2009  3:07PM - [facility], JOSEPH: hyperplastic and tubular   adenoma        Gross Hematuria  05/13/2009     Created by Conversion        Osteopenia 06/14/2008     Created by Conversion        Cataract 05/10/2008     Created by Conversion        Benign Paroxysmal Positional Vertigo 05/10/2008     Created by Conversion        Allergic Reaction 11/23/2005     Created by Conversion  [facility] Annotation: Nov 23 2005  2:31PM -  ,  : cold medications make her hyper        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/2005     Created by Conversion  [facility] Annotation: Nov 23 2005  2:29PM -  ,  : ham           Past Medical History:   Diagnosis Date    Tibia/fibula fracture 04/21/2012       Past Surgical History:   Procedure Laterality Date    CONVERTED PROCEDURE      Filleted Finger Flap     orif  2014    left ankle    TONSILLECTOMY      TONSILLECTOMY AND ADENOIDECTOMY         Family History   Problem Relation Age of Onset    Cancer Mother      gallbladder cancer    High Blood Pressure Mother     Arrhythmia Mother      atrial fibrillation    Colon polyps Mother     Breast cancer Mother [id number]    Other Mother      Malignant Hyperthermia    High Blood Pressure Brother     Heart failure Father     Arrhythmia Father      Atrial fibrillation    Hearing loss Father     Osteoarthritis Father     Cataracts Father     Conversion Other      [id number]^Malignant Hyperthermia Due To Anesthesia^^Active^    Cancer Maternal Grandmother     Diabetes Maternal Grandmother     Cancer Maternal Grandfather     Stroke Paternal Grandmother     Heart Disease Paternal Grandfather     Macular degeneration Neg Hx     Glaucoma Neg Hx  Retinal detachment Neg Hx     Amblyopia (Lazy Eye) Neg Hx     Blindness Neg Hx        Social History     Social History    Marital status: Married     Spouse name: N/A    Number of children: N/A    Years of education: N/A     Social History Main Topics    Smoking status: Never Smoker    Smokeless tobacco: Never Used    Alcohol use 1.2 oz/week     2 Glasses of wine per week    Drug use: No     Sexual activity: Yes     Partners: Male     Birth control/ protection: Post-menopausal     Other Topics Concern    None     Social History Narrative    exercising irregularly       Objective    Vitals:    03/02/16 0855   BP: 138/88   Pulse: 64   Weight: 62.6 kg (138 lb)   Height: 1.52 m (4' 11.84")     Body mass index is 27.09 kg/(m^2).    GENERAL: Alert, in no apparent distress,  Normal Habitus.  Appears stated age.    PSYCH:  good attention, well groomed, oriented, normal affect  SKIN: No petechiae, no jaundice  EYES: Anicteric. Pupils Equally round.  EARS: Normal pinnae.   Acuity to conversational tones is good.  MOUTH: Moist mucous membranes, normal pharynx.  Gums pink.  good dentition.  NECK: Full ROM grossly.  trachea midline.    LYMPH:  no cervical or supraclavicular lymphadenopathy.    CHEST: Clear to auscultation bilaterally.   CARDIAC: Regular without gallops or rubs;  no heaves, no thrills;  no murmurs.  No JVD.  VASCULAR:  No edema.  ABDOMEN: Soft, nontender, non distended, normoactive bowel sounds.  NEURAL EXAM: Normal speech.  Observed dexterity without tremor.    Assessment/Plan:    71 y/o F who presents to clinic for pre-operative clearance.    Family History of Malignant Hypothermia  Recommend avoiding volatile anesthetics and succinylcholine    Osteopenia  Hold calcium and vitamin D day of surgery     B/L Cataracts  Scheduled for surgery on  1/23 and 2/6.    Pre-OP Clearance  No evidence of unstable ACS, decompensated heart failure, significant arrhythmia, or sever valvular heart disease.  It should be noted that she has risk factors for OSA. This should not impact cataract surgery.  ECG performed today shows sinus rhythm at 60 bpm, normal axis, QTc-418, and no acute ischemic changes. No significant changes from 12/18/14.   RCRI = 0. This corresponds to a 0.4% chance of having a major perioperative cardiac event.  She is at low risk for a low risk surgery.  She is cleared for cataract surgery on  1/23 and 2/6     Brayton Mars, MD  03/02/2016  9:30 AM

## 2016-03-02 NOTE — Patient Instructions (Signed)
1) You are clear for surgery

## 2016-03-03 ENCOUNTER — Telehealth: Payer: Self-pay | Admitting: Ophthalmology

## 2016-03-03 NOTE — Telephone Encounter (Signed)
Patient called stating she is supposed inform Dr. Graciela HusbandsKlein what kind of implant she would like for the surgery. Patient stated she would prefer to see distance without glasses. Patient also stated that her PCP okayed her for CAT surgery.

## 2016-03-05 ENCOUNTER — Encounter (INDEPENDENT_AMBULATORY_CARE_PROVIDER_SITE_OTHER): Payer: Self-pay

## 2016-03-16 ENCOUNTER — Encounter: Payer: Self-pay | Admitting: Anesthesiology

## 2016-03-16 ENCOUNTER — Encounter
Admission: RE | Disposition: A | Discharge status: Home / Self Care | Payer: Self-pay | Source: Ambulatory Visit | Attending: Ophthalmology

## 2016-03-16 ENCOUNTER — Ambulatory Visit
Admission: RE | Admit: 2016-03-16 | Discharge: 2016-03-16 | Disposition: A | Discharge status: Home / Self Care | Payer: Self-pay | Source: Ambulatory Visit | Attending: Ophthalmology | Admitting: Ophthalmology

## 2016-03-16 ENCOUNTER — Ambulatory Visit: Payer: Self-pay | Admitting: Ophthalmology

## 2016-03-16 DIAGNOSIS — Z961 Presence of intraocular lens: Secondary | ICD-10-CM

## 2016-03-16 HISTORY — PX: PR XCAPSL CTRC RMVL INSJ IO LENS PROSTH W/O ECP: 66984

## 2016-03-16 SURGERY — PHACOEMULSIFICATION, CATARACT, EXTRACAPSULAR
Anesthesia: Monitor Anesthesia Care | Site: Eye | Laterality: Left | Wound class: Clean

## 2016-03-16 MED ORDER — ACETAMINOPHEN 325 MG/10.15 ML WRAPPED *I*
975.0000 mg | Freq: Once | Status: DC
Start: 2016-03-16 — End: 2016-03-16

## 2016-03-16 MED ORDER — SODIUM CHLORIDE 0.9 % IV SOLN WRAPPED *I*
20.0000 mL/h | Status: DC
Start: 2016-03-16 — End: 2016-03-16

## 2016-03-16 MED ORDER — BSS IO SOLN *I*
INTRAOCULAR | Status: DC | PRN
Start: 2016-03-16 — End: 2016-03-16
  Administered 2016-03-16: 15 mL

## 2016-03-16 MED ORDER — EPINEPHRINE 1 MG/ML IJ SOLUTION WRAPPED *I*
INTRAOCULAR | Status: DC | PRN
Start: 2016-03-16 — End: 2016-03-16
  Administered 2016-03-16: 500 mL

## 2016-03-16 MED ORDER — CEFUROXIME 10 MG/ML INTRACAMERAL INJ *I*
INTRACAMERAL | Status: DC | PRN
Start: 2016-03-16 — End: 2016-03-16
  Administered 2016-03-16: 0.1 mL via INTRACAMERAL

## 2016-03-16 MED ORDER — MIDAZOLAM HCL 1 MG/ML IJ SOLN *I* WRAPPED
INTRAMUSCULAR | Status: DC | PRN
Start: 2016-03-16 — End: 2016-03-16
  Administered 2016-03-16 (×2): 1 mg via INTRAVENOUS

## 2016-03-16 MED ORDER — CYCLOPENTOLATE-PHENYLEPHRINE-MOXIFLOXACIN-TROPICAMIDE (VIGAMOX POWER) *I*
OPHTHALMIC | Status: AC
Start: 2016-03-16 — End: 2016-03-16
  Filled 2016-03-16: qty 0.5

## 2016-03-16 MED ORDER — KETOROLAC TROMETHAMINE 30 MG/ML IJ SOLN *I*
INTRAMUSCULAR | Status: DC | PRN
Start: 2016-03-16 — End: 2016-03-16
  Administered 2016-03-16: 15 mg via INTRAVENOUS

## 2016-03-16 MED ORDER — ONDANSETRON HCL 2 MG/ML IV SOLN *I*
4.0000 mg | Freq: Once | INTRAMUSCULAR | Status: AC | PRN
Start: 2016-03-16 — End: 2016-03-16

## 2016-03-16 MED ORDER — ACETAMINOPHEN 325 MG PO TABS *I*
650.0000 mg | ORAL_TABLET | ORAL | Status: DC | PRN
Start: 2016-03-16 — End: 2016-03-17

## 2016-03-16 MED ORDER — ACETAMINOPHEN 80 MG PO TBDP *I*
960.0000 mg | ORAL_TABLET | Freq: Once | ORAL | Status: DC
Start: 2016-03-16 — End: 2016-03-16

## 2016-03-16 MED ORDER — LIDOCAINE HCL 1 % IJ SOLN *I*
0.1000 mL | INTRAMUSCULAR | Status: DC | PRN
Start: 2016-03-16 — End: 2016-03-16

## 2016-03-16 MED ORDER — FLURBIPROFEN SODIUM 0.03 % OP SOLN *I*
OPHTHALMIC | Status: AC
Start: 2016-03-16 — End: 2016-03-16
  Filled 2016-03-16: qty 2.5

## 2016-03-16 MED ORDER — FENTANYL CITRATE 50 MCG/ML IJ SOLN *WRAPPED*
INTRAMUSCULAR | Status: DC | PRN
Start: 2016-03-16 — End: 2016-03-16
  Administered 2016-03-16 (×2): 50 ug via INTRAVENOUS

## 2016-03-16 MED ORDER — MOXIFLOXACIN HCL 0.5 % OP SOLN *I*
OPHTHALMIC | Status: DC | PRN
Start: 2016-03-16 — End: 2016-03-16
  Administered 2016-03-16: 1 [drp] via TOPICAL

## 2016-03-16 MED ORDER — CYCLOPENTOLATE-PHENYLEPHRINE-MOXIFLOXACIN-TROPICAMIDE (VIGAMOX POWER) *I*
1.0000 [drp] | OPHTHALMIC | Status: AC
Start: 2016-03-16 — End: 2016-03-16
  Administered 2016-03-16 (×3): 1 [drp] via OPHTHALMIC

## 2016-03-16 MED ORDER — FENTANYL CITRATE 50 MCG/ML IJ SOLN *WRAPPED*
INTRAMUSCULAR | Status: AC
Start: 2016-03-16 — End: 2016-03-16
  Filled 2016-03-16: qty 2

## 2016-03-16 MED ORDER — KETOROLAC TROMETHAMINE 0.5 % OP SOLN *I*
1.0000 [drp] | Freq: Four times a day (QID) | OPHTHALMIC | 0 refills | Status: DC
Start: 2016-03-19 — End: 2016-05-03

## 2016-03-16 MED ORDER — POVIDONE-IODINE 5 % OP SOLN *I*
OPHTHALMIC | Status: DC | PRN
Start: 2016-03-16 — End: 2016-03-16
  Administered 2016-03-16: 30 mL via TOPICAL

## 2016-03-16 MED ORDER — TIMOLOL MALEATE 0.5 % OP SOLN *I*
OPHTHALMIC | Status: DC | PRN
Start: 2016-03-16 — End: 2016-03-16
  Administered 2016-03-16: 1 [drp] via TOPICAL

## 2016-03-16 MED ORDER — MOXIFLOXACIN HCL 0.5 % OP SOLN *I*
1.0000 [drp] | OPHTHALMIC | 0 refills | Status: DC
Start: 2016-03-16 — End: 2016-05-03

## 2016-03-16 MED ORDER — MIDAZOLAM HCL 1 MG/ML IJ SOLN *I* WRAPPED
INTRAMUSCULAR | Status: AC
Start: 2016-03-16 — End: 2016-03-16
  Filled 2016-03-16: qty 2

## 2016-03-16 MED ORDER — FLURBIPROFEN SODIUM 0.03 % OP SOLN *I*
1.0000 [drp] | Freq: Once | OPHTHALMIC | Status: AC
Start: 2016-03-16 — End: 2016-03-16
  Administered 2016-03-16: 1 [drp] via OPHTHALMIC

## 2016-03-16 MED ORDER — ONDANSETRON HCL 2 MG/ML IV SOLN *I*
INTRAMUSCULAR | Status: DC | PRN
Start: 2016-03-16 — End: 2016-03-16
  Administered 2016-03-16: 4 mg via INTRAMUSCULAR

## 2016-03-16 MED ORDER — LACTATED RINGERS IV SOLN *I*
20.0000 mL/h | INTRAVENOUS | Status: DC
Start: 2016-03-16 — End: 2016-03-16
  Administered 2016-03-16: 20 mL/h via INTRAVENOUS

## 2016-03-16 MED ORDER — PREDNISOLONE ACETATE 1 % OP SUSP *I*
OPHTHALMIC | Status: DC | PRN
Start: 2016-03-16 — End: 2016-03-16
  Administered 2016-03-16: 1 [drp] via OPHTHALMIC

## 2016-03-16 MED ORDER — ACETAMINOPHEN 325 MG PO TABS *I*
975.0000 mg | ORAL_TABLET | Freq: Once | ORAL | Status: DC
Start: 2016-03-16 — End: 2016-03-16

## 2016-03-16 MED ORDER — LIDOCAINE HCL 1 % IJ SOLN *I*
INTRAMUSCULAR | Status: DC | PRN
Start: 2016-03-16 — End: 2016-03-16
  Administered 2016-03-16: 1 mL via SUBCUTANEOUS

## 2016-03-16 MED ORDER — PREDNISOLONE ACETATE 1 % OP SUSP *I*
1.0000 [drp] | OPHTHALMIC | 0 refills | Status: DC
Start: 2016-03-16 — End: 2016-05-03

## 2016-03-16 MED ORDER — PROMETHAZINE HCL 25 MG/ML IJ SOLN *I*
6.2500 mg | Freq: Once | INTRAMUSCULAR | Status: DC | PRN
Start: 2016-03-16 — End: 2016-03-16

## 2016-03-16 SURGICAL SUPPLY — 27 items
BAG BSS CENTURION 500ML (Drug) ×3 IMPLANT
CARTRIDGE D MONARCH III F/IOL (Supply) ×3 IMPLANT
COVER MAYO STAND (Drape) ×2
COVER MAYO STD W24XL53IN 3 LAYR SMS RECOIL TECH DISP (Drape) ×1 IMPLANT
DRAPE INSTRUMENT CLEAR ADHESIVE (Drape) ×3 IMPLANT
DRAPE SURG INCISE XL 51 X 51IN STER (Drape) ×3 IMPLANT
GLOVE BIOGEL PI MICRO SZ 6 (Glove) ×3 IMPLANT
GLOVE BIOGEL PI MICRO SZ 7.5 (Glove) ×3 IMPLANT
HANPIECE POLYMER I/A TRANSFORMER (Supply) IMPLANT
KNIFE ANGLED SLIT 2.8MM DISCONTINUED (Other) IMPLANT
KNIFE CLEARCUT ANGL BVL 2.4MM (Other) ×3 IMPLANT
KNIFE MVR EDGE AHEAD (Supply) ×3 IMPLANT
LENS CONTACT DAY/NIGHT AIR OPTIX (Supply) IMPLANT
LENS POST SN60WF +15.5 (Implant) ×2 IMPLANT
NEEDLE HYPO REG BVL 18G X 1.5IN PINK (Needle) ×3 IMPLANT
PACK CENTUR FMS ULTRA BAL 45DEG BEVEL UP REPLACED WITH 299597 (Other) ×3
PACK CUSTOM EYE PACK (Other) ×3 IMPLANT
PACK LIQUID OPTIC INTERFACE BULK STER (Other) IMPLANT
RING MALYUGIN 2.0 7.0MM (Supply) IMPLANT
SEALANT LUBRICATING IORT MGMT OF CLR CORNEAL INCISIONS RESURE (Other) IMPLANT
SET DISPOSABLE BIMANUAL POLYMER I/A (Other) IMPLANT
SOL PROVISC OPTHALMIC 1PCT .85ML (Drug) ×3 IMPLANT
SOL VISCOAT .75ML (Supply) ×2
SOLUTION VISCOELASTIC 0.75ML 40-30MG/ML SOD CHONDROITIN SULF SOD HYALURONATE IO PREFIL SYR (Supply) ×1 IMPLANT
SYRINGE TB SAFETYGLIDE 1CC 27G X 1/2IN (Supply) ×3 IMPLANT
SYSTEM FLD MGMT DIA0.9MM 45DEG ULT BAL VISN ACT INTREPID CENTURION (Other) ×1 IMPLANT
TIP POLYMER I/A 0.3MM (Supply) ×3 IMPLANT

## 2016-03-16 NOTE — Anesthesia Preprocedure Evaluation (Signed)
Anesthesia Pre-operative History and Physical for [name]      Summary:  71 yo female presents for left cataract procedure  By Azucena Fallen, MD at 9:12 AM on 03/16/2016    <[email or web address]>  Anesthesia Evaluation Information Source: family, records, patient     ANESTHESIA    + Family Hx of Anesthetic Complications            MH    GENERAL     Denies general issues    HEENT     Denies HEENT issues PULMONARY     Denies pulmonary issues    CARDIOVASCULAR     Denies cardiovascular issues    GI/HEPATIC/RENAL     Denies GI/hepatic/renal issues  Last PO Intake: >8hr before procedure and >2hr before procedure (clears) NEURO/PSYCH     Denies neuro/psych issues    ENDO/OTHER     Denies endo issues    HEMALOGIC     Denies hematologic issues       Physical Exam    Airway            Mouth opening: normal            Mallampati: II            TM distance (fb): >3 FB            Neck ROM: full     Cardiovascular  Normal Exam           Rhythm: regular           Rate: normal       Pulmonary   Normal Exam    breath sounds clear to auscultation    Mental Status   Normal Exam    oriented to person, place and time       ________________________________________________________________________  Plan  ASA Score  1  Anesthetic Plan MAC    Induction (routine IV); Line ( use current access); Monitoring (standard ASA)    Informed Consent     Risks:          Risks discussed were commensurate with the plan listed above with the following specific points: N/V and hypotension, unexpected serious injury, allergic Rx    Anesthetic Consent:      Anesthetic plan (and risks as noted above) were discussed with patient and spouse    Plan also discussed with team members including:  CRNA    Attending Attestation:  As the primary attending anesthesiologist, I attest that the patient or proxy understands and accepts the risks and benefits of the anesthesia plan. I also attest that I have personally performed a pre-anesthetic examination and  evaluation, and prescribed the anesthetic plan for this particular location within 48 hours prior to the anesthetic as documented. Azucena Fallen, MD 9:13 AM

## 2016-03-16 NOTE — Anesthesia Procedure Notes (Signed)
---------------------------------------------------------------------------------------------------------------------------------------    AIRWAY   GENERAL INFORMATION AND STAFF    Patient location during procedure: OR       Date of Procedure: 03/16/2016 9:42 AM  CONDITION PRIOR TO MANIPULATION     Current Airway/Neck Condition:  Normal        For more airway physical exam details, see Anesthesia PreOp Evaluation  FINAL AIRWAY DETAILS    Final Airway Type:  Nasal cannula    Head position required to avoid obstruction:  Neutral    Insertion Site:  Left naris and right naris  ----------------------------------------------------------------------------------------------------------------------------------------

## 2016-03-16 NOTE — Anesthesia Postprocedure Evaluation (Signed)
Anesthesia Post-Op Note    Patient: [name]    Procedure(s) Performed:  Procedure Summary     Date Anesthesia Start Anesthesia Stop Room / Location    03/16/16 0933 [id number]       Procedure Diagnosis Surgeon Attending Anesthesia    PHACO W/ IOL (Left Eye) Combined form of age-related cataract, both eyes  (Combined form of age-related cataract, both eyes [H25.813]) Ladora DanielKlein, Christian, MD Dia CrawfordSchroeder, Tracey Hermance, MD        Recovery Vitals  BP: 129/61 (03/16/2016 10:01 AM)  Heart Rate: 66 (03/16/2016  8:52 AM)  Heart Rate (via Pulse Ox): 65 (03/16/2016 10:01 AM)  Resp: 16 (03/16/2016 10:01 AM)  Temp: 36.3 C (97.3 F) (03/16/2016 10:01 AM)  SpO2: 98 % (03/16/2016 10:01 AM)   0-10 Scale: 0 (03/16/2016 10:01 AM)  Anesthesia type:  Monitor Anesthesia Care  Complications Noted During Procedure or in PACU:  None   Comment:    Patient Location:  ASC  Level of Consciousness:    Recovered to baseline  Patient Participation:     Able to participate  Temperature Status:    Normothermic  Oxygen Saturation:    Within patient's normal range  Cardiac Status:   Within patient's normal range  Fluid Status:    Stable  Airway Patency:     Yes  Pulmonary Status:    Baseline  Pain Management:    Adequate analgesia  Nausea and Vomiting:  None    Post Op Assessment:    Tolerated procedure well   Attending Attestation:  All indicated post anesthesia care provided     -

## 2016-03-16 NOTE — Anesthesia Case Conclusion (Signed)
CASE CONCLUSION  [facility]  Transport  Directly to: ASC  Position:  Recumbent  Patient Condition on Handoff  Level of Consciousness:  Alert/talking/calm  Patient Condition:  Stable  Handoff Report to:  RN

## 2016-03-16 NOTE — Op Note (Addendum)
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      Implant LENS POST SN60WF +15.5 - [id number] Implanted [id number]              Description of Procedure:     Prior to the day of surgery, the patient was informed of the risks, benefits, and alternatives of cataract surgery and written informed consent was obtained. On the day of surgery, the patient was met in the pre-operative staging area, questions were answered, and the correct operative eye, being the left eye, was marked.  The eye was instilled with a drop of betadine and a ribbon of lidocaine jelly in the superior and inferior fornices prior to transfer to the operating room.    The patient was then taken to the operating suite and placed in the supine position. Cardiac monitors were placed. A time out was called and all members of the operating team confirmed that the left eye was the correct operative eye, and that cataract surgery was the correct procedure.  Monitored anesthesia care was provided by the anesthesia service. The patient was prepped and draped in a sterile ophthalmic fashion. A lid speculum was placed in  the eye.    A 1 mm paracentesis incision was made in the cornea for the left hand and non-preserved lidocaine was injected into the anterior chamber.  Next, a dispersive viscoelastic was used to expand the anterior chamber.   A 2.4 mm keratome incision was then made temporally at the limbus.     A continuous curvilinear capsulorhexis was initiated with the cystitome followed by injection of Provisc. The rhexis was completed with the micro-Utrada forceps.  Hydrodissection was performed with BSS on a chang cannula and the lens was noted to freely rotate. Next the phacoemulsification handpiece was inserted into the eye and the lens was removed with a chop technique. Irrigation and aspiration was used to remove residual cortex. The capsular bag was filled with cohesive viscoelastic and the SN60WF lens of power +15.5 D (serial number [id number]) was injected into the capsular bag. The trailing haptic was placed under the anterior capsule using a second instrument. The I/A hand piece was used to remove residual viscoelastic. 0.1 ml of Cefuroxime was  injected into the anterior chamber.  The wounds were hydrated and found to be water tight.  The intraocular pressure of the eye was checked with palpation and found to be normal.     The lid speculum was removed and the 2 drops of Vigamox and 1 drop of 0.5% Timolol were placed on the ocular surface. The eye was cleaned and a clear plastic shield was placed over the eye. The patient was brought to the recovery room in good condition. The patient was given postoperative instructions and will follow up with Dr. Caryl Comes at the [facility] tomorrow morning.          Signed:  Ellen Henri, MD  on 03/16/2016 at 10:04 AM     I was present throughout the entire procedure.    Corine Shelter, MD

## 2016-03-16 NOTE — INTERIM OP NOTE (Signed)
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      Implant LENS POST SN60WF +15.5 - [id number] Implanted [id number]                Packing:                 Patient Condition: good       Findings (Including unexpected complications): Visually significant cataract, left eye, now status post cataract extraction.  Please see operative note for details.       Signed:  Boyce MediciBrittany Janele Lague, MD  on 03/16/2016 at 10:03 AM

## 2016-03-16 NOTE — Discharge Instructions (Signed)
Post Operative Discharge Instructions    Name: [name]  DOB: [birth year]     Age: 71 y.o.  MRN: [id number]    Date: 03/16/2016    Procedure: cataract extraction with placement of intraocular lens, left eye    You have received sedative medications and/or general anesthesia which may make you drowsy for as long as 24 hours:       -  DO NOT drive or operate any machinery for 24 hours       -  DO NOT drink alcoholic beverages for 24 hours       -  DO NOT make major decisions, sign contracts, etc. for 24 hours.    Please continue to adhere to these precautions if you are taking narcotic medication.    Discomfort after surgery       -  A scratchy feeling or occasional sharp pain is normal       -  If you have a severe headache, nausea, or uncontrolled pain, call your doctor at [phone].    Eye care after surgery       -  Remove eye shield in 3 hours        -  After removing the eye shield you can wear provided sunglasses for protection and light sensitivity during waking hours.  Replace the eye shield at bedtime for protection.       -  Keep the eye dry       -  Redness is common and gradually diminshes over time. Blood stained tears can occur and are also normal.    Activity after surgery       -  You must avoid lifting heavy objects and bending over       -  You may read and watch TV       -  You may drive when your doctor gives you permission    Diet after surgery       -  Resume previous diet    Medications       -  Please resume all of your usual medications       -  After removing the eye shield, begin taking the following medications to the left eye:    - Prednisolone acetate (Pred Forte) 1% one drop every 2 hours while awake    - Moxifloxacin (Vigamox) 0.5% one drop every 2 hours while awake       -  You have been given a prescription for Ketorolac (Acular) 0.5%.  In 3 days, begin taking Ketorolac (Acular) 0.5% one drop 4 times daily to the left eye       - Please wait 5 minutes in between drops.      -   You may take acetaminophen (Tylenol) as needed for pain    Your follow-up appointment is on 03/17/16 at 9:15am with Dr. Graciela HusbandsKlein  Location:  [facility]    [facility]    [address]    [place], [place] [id number]         -  If you have an emergency and are unable to contact your doctor at the [facility] [phone]) or Ophthalmology after hours answering service [phone]), call the Emergency Department [phone]).    Authored by Boyce MediciBrittany Simmons, MD on 03/16/2016 at 10:04 AM    No Motrin/Advil/Ibuprofen until after 3:30pm

## 2016-03-16 NOTE — H&P (Signed)
UPDATES TO PATIENT'S CONDITION on the DAY OF SURGERY/PROCEDURE    I. Updates to Patient's Condition (to be completed by a provider privileged to complete a H&P, following reassessment of the patient by the provider):    Day of Surgery/Procedure Update:  History  History reviewed and no change    Physical  Physical exam updated and no change            II. Procedure Readiness   I have reviewed the patient's H&P and updated condition. By completing and signing this form, I attest that this patient is ready for surgery/procedure.    III. Attestation   I have reviewed the updated information regarding the patient's condition and it is appropriate to proceed with the planned surgery/procedure.    Ladora Danielhristian Navaeh Kehres, MD as of 9:29 AM 03/16/2016

## 2016-03-17 ENCOUNTER — Encounter: Payer: Self-pay | Admitting: Ophthalmology

## 2016-03-17 ENCOUNTER — Ambulatory Visit: Payer: Self-pay | Admitting: Ophthalmology

## 2016-03-17 DIAGNOSIS — H35361 Drusen (degenerative) of macula, right eye: Secondary | ICD-10-CM

## 2016-03-17 DIAGNOSIS — H25811 Combined forms of age-related cataract, right eye: Secondary | ICD-10-CM

## 2016-03-17 DIAGNOSIS — Z961 Presence of intraocular lens: Secondary | ICD-10-CM

## 2016-03-17 DIAGNOSIS — H43813 Vitreous degeneration, bilateral: Secondary | ICD-10-CM

## 2016-03-17 NOTE — Progress Notes (Signed)
Outpatient Visit      Patient name: [name]  DOB: November 27, 1945       Age: 71 y.o.  MR#: [id number]    Encounter Date: 03/17/2016    Subjective:      Chief Complaint   Patient presents with    Post Cataract Surgery     1 day , left eye - first eye      HPI     Post Cataract Surgery    Additional comments: 1 day , left eye - first eye            Comments   [name] is a 71 y.o. female who returns 1 day after CE with   SN60WF +15.5 IOL , OS.       Oc meds: OS: VIG/PRED Q2hr while awake        Last edited by Ladora DanielKlein, Ryot Burrous, MD on 03/17/2016  9:27 AM. (History)        has a current medication list which includes the following prescription(s): moxifloxacin, prednisolone acetate, multiple vitamins-minerals, calcium carbonate-vitamin d, fish oil pearls, multivitamin, and ketorolac.     is allergic to sevoflurane and succinylcholine.      Past Medical History:   Diagnosis Date    Tibia/fibula fracture 04/21/2012      Past Surgical History:   Procedure Laterality Date    CONVERTED PROCEDURE      Filleted Finger Flap     orif  2014    left ankle    TONSILLECTOMY      TONSILLECTOMY AND ADENOIDECTOMY          Specialty Problems        Ophthalmology Problems    Cataract        Extramacular Drusen, right eye        PVD (posterior vitreous detachment), bilateral        Refractive error        Pseudophakia of left eye (SN60WF +15.5D 03/16/2016)               ROS     Positive for: Eyes    Last edited by Tongue, [place] on 03/17/2016  9:07 AM. (History)         Objective:     Base Eye Exam     Visual Acuity (Snellen - Linear)      Right Left   Dist sc  20/60 +2         Tonometry (Tonopen, 9:19 AM)      Right Left   Pressure  17         Neuro/Psych     Oriented x3:  Yes    Mood/Affect:  Normal            Slit Lamp and Fundus Exam     External Exam      Right Left    External Normal ocular adnexae, lacrimal gland & drainage, orbits Normal ocular adnexae, lacrimal gland & drainage, orbits      Slit Lamp Exam       Right Left    Lids/Lashes Normal structure & position Normal structure & position    Conjunctiva/Sclera Normal bulbar/palpebral, conjunctiva, sclera Normal bulbar/palpebral, conjunctiva, sclera    Cornea Normal epithelium, stroma, endothelium, tear film Normal epithelium, stroma, endothelium, tear film    Anterior Chamber Clear & deep 1+ cell    Iris Normal shape, size, morphology moderate dilation    Lens 1-2+ ASC, 1-2+ NS PCIOL    Vitreous  PVD PVD      Fundus Exam      Right Left    Disc Normal size, appearance, nerve fiber layer Normal size, appearance, nerve fiber layer    C/D Ratio 0.3 0.3    Macula Normal Normal    Vessels Normal Normal    Periphery Drusen, Extramacular drusen, PVD PVD, No drusen                                  Assessment/Plan:     1. Pseudophakia of left eye (SN60WF +15.5D 03/16/2016)     2. Combined forms of age-related cataract of right eye     3. Extramacular Drusen, right eye     4. PVD (posterior vitreous detachment), bilateral          PLAN:  1. 1 day s/p cataract extraction/intraocular lens, OS   - Doing well   - Vigamox 1 drop four times daily for 1 week   - Prednisolone acetate 1% 1 drop four times daily   - Acular (ketorolac) 1 drop four times daily - START in 2 DAYS   - Wear eye shield at bedtime for 2 more nights   - Call ASAP for decreasing vision, flashing lights, floaters, or any other concerning symptoms.   - Follow-up 1 week    2. Consent next visit    3. Seen by Dr. Johnnette Litter and noted to be extra macular

## 2016-03-24 ENCOUNTER — Encounter: Payer: Self-pay | Admitting: Ophthalmology

## 2016-03-24 ENCOUNTER — Ambulatory Visit: Payer: Self-pay | Admitting: Ophthalmology

## 2016-03-24 VITALS — BP 128/67 | HR 76

## 2016-03-24 DIAGNOSIS — H25811 Combined forms of age-related cataract, right eye: Secondary | ICD-10-CM

## 2016-03-24 DIAGNOSIS — Z961 Presence of intraocular lens: Secondary | ICD-10-CM

## 2016-03-24 DIAGNOSIS — H35361 Drusen (degenerative) of macula, right eye: Secondary | ICD-10-CM

## 2016-03-24 NOTE — Progress Notes (Signed)
Outpatient Visit      Patient name: [name]  DOB: 08-05-1945       Age: 71 y.o.  MR#: [id number]    Encounter Date: 03/24/2016    Subjective:      Chief Complaint   Patient presents with    Follow-up     HPI     1 Week post Cat surgery OS.  States vision has improved.  Denies pain, flashes, and floaters.  Was planning on having surgery on Tuesday 03/30/16 for cataract OD but is   currently sick.  Gtts: PF TID          Vigamox TID           Last edited by Ladora Daniel, MD on 03/24/2016  8:41 AM.     has a current medication list which includes the following prescription(s): moxifloxacin, prednisolone acetate, ketorolac, multiple vitamins-minerals, calcium carbonate-vitamin d, fish oil pearls, and multivitamin.     is allergic to sevoflurane and succinylcholine.      Past Medical History:   Diagnosis Date    Tibia/fibula fracture 04/21/2012      Past Surgical History:   Procedure Laterality Date    CONVERTED PROCEDURE      Filleted Finger Flap     orif  2014    left ankle    PR REMV CATARACT EXTRACAP,INSERT LENS Left 03/16/2016    Procedure: PHACO W/ IOL;  Surgeon: Ladora Daniel, MD;  Location: [facility] MAIN OR;  Service: Ophthalmology    TONSILLECTOMY      TONSILLECTOMY AND ADENOIDECTOMY          Specialty Problems        Ophthalmology Problems    Cataract        Extramacular Drusen, right eye        PVD (posterior vitreous detachment), bilateral        Refractive error        Pseudophakia of left eye (SN60WF +15.5D 03/16/2016)               ROS     Positive for: Eyes (PVD OU, cataract OD, CE IOL OS)    Negative for: Constitutional, Gastrointestinal, Neurological, Skin,   Genitourinary, Musculoskeletal, HENT, Endocrine, Cardiovascular,   Respiratory, Psychiatric, Allergic/Imm, Heme/Lymph    Last edited by Vanetta Shawl on 03/24/2016  8:23 AM. (History)         Objective:     Base Eye Exam     Visual Acuity (Snellen - Linear)      Right Left   Dist sc  20/30 -2   Dist cc 20/30 -2           Correction:   Glasses      Tonometry (Tonopen, 8:31 AM)      Right Left   Pressure 10 13         Pupils      Pupils Dark Light React APD   Right PERRLA 6 4 Slow None   Left PERRLA 6 4 Slow None         Visual Fields (Counting fingers)      Left Right   Result Full Full         Extraocular Movement      Right Left   Result Full Full         Neuro/Psych     Oriented x3:  Yes    Mood/Affect:  Normal      Dilation     Left eye:  2.5% Phenylephrine, 1.0% Tropicamide @ 8:31 AM            Slit Lamp and Fundus Exam     External Exam      Right Left    External Normal ocular adnexae, lacrimal gland & drainage, orbits Normal ocular adnexae, lacrimal gland & drainage, orbits      Slit Lamp Exam      Right Left    Lids/Lashes Normal structure & position Normal structure & position    Conjunctiva/Sclera Normal bulbar/palpebral, conjunctiva, sclera Normal bulbar/palpebral, conjunctiva, sclera    Cornea Normal epithelium, stroma, endothelium, tear film TCCI    Anterior Chamber Clear & deep few cells    Iris Normal shape, size, morphology moderate dilation    Lens 1-2+ ASC, 1-2+ NS PCIOL    Vitreous PVD PVD      Fundus Exam      Right Left    Disc Normal size, appearance, nerve fiber layer Normal size, appearance, nerve fiber layer    C/D Ratio 0.3 0.3    Macula Normal Normal    Vessels Normal Normal    Periphery Drusen, Extramacular drusen, PVD PVD, No drusen            Refraction     Manifest Refraction      Sphere Cylinder Axis Dist   Right       Left -0.50 +0.25 150 20/20-1                                     Assessment/Plan:     1. Pseudophakia of left eye (SN60WF +15.5D 03/16/2016)     2. Combined forms of age-related cataract of right eye     3. Extramacular Drusen, right eye          PLAN:  1. 1 week s/p cataract extraction/intraocular lens, OS   - Doing well   - Stop Vigamox   - Taper prednisolone acetate and ketorolac:     - Three times daily x 1 week    - Twice daily x 1 week    - Once daily x 1 week, then stop   - Follow-up next week  or sooner for decreasing vision, flashing lights, floaters, or any other concerning symptoms.      2. Vis sig cat OD as described previously.  - Discussed r/b/a to cataract surgery with intraocular lens implantation including loss of vision, loss of eye, retina detachment, cystic macular edema, prolonged healing/iritis, wound leak, infection, need for drops post-operatively, need for glasses post operatively, possible need for subsequent surgery to address any complications.  - target distance

## 2016-03-29 NOTE — Anesthesia Preprocedure Evaluation (Signed)
Anesthesia Pre-operative History and Physical for [name]  .  Marland Kitchen  Anesthesia Evaluation       GENERAL  Comment:  Allergic Reaction 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    Cataract Benign Paroxysmal Positional Vertigo    Osteopenia Gross Hematuria    Benign Polyps Of The Large Intestine Family history of malignant hyperthermia    Actinic keratosis Extramacular Drusen, right eye    Refractive error PVD (posterior vitreous detachment), bilateral    Pseudophakia of left eye (SN60WF +15.5D 03/16/2016)                   GI/HEPATIC/RENAL  Last PO Intake: Enter Last PO Intake in ROS/Med Hx Tab  Comment:       Benign Polyps Of The Large Intestine    NEURO/PSYCH    Comment:  Benign Paroxysmal Positional Vertigo                HEMATOLOGIC    Comment:   Osteopenia          Physical Exam    Airway            Mouth opening: normal            Mallampati: I            TM distance (fb): >3 FB            Neck ROM: full            Neck circumference (in): 15-17  Dental   Normal Exam   Cardiovascular  Normal Exam           Rhythm: regular           Rate: normal      Neurologic    Normal Exam    Current Pain Score: 0/10      Baseline Pain Score: 0/10    General Survey    Normal Exam   Pulmonary   Normal Exam    breath sounds clear to auscultation    Mental Status     oriented to person, place and time    Operative Site    Normal Exam       ________________________________________________________________________  PLAN  ASA Score  2  Anesthetic Plan MAC     Induction (routine IV) General Anesthesia/Sedation Maintenance Plan (IV bolus); Airway (nasal cannula and mask); Line ( use current access); Monitoring (standard ASA); Positioning (supine); PONV Plan (dexamethasone and ondansetron); Pain (per surgical team); PostOp (PACU or step-down unit)    Informed Consent   Risks not discussed because of patient declined to hear risks    Anesthetic Consent:         Anesthetic plan (and risks as noted above) were discussed with  patient    Plan also discussed with team members including:       CRNA, surgeon and attending    Attending Attestation:  As the primary attending anesthesiologist, I attest that the patient or proxy understands and accepts the risks and benefits of the anesthesia plan. I also attest that I have personally performed a pre-anesthetic examination and evaluation, and prescribed the anesthetic plan for this particular location within 48 hours prior to the anesthetic as documented. Elson Areas, MD 3:04 PM

## 2016-03-30 ENCOUNTER — Encounter: Payer: Self-pay | Admitting: Anesthesiology

## 2016-03-30 ENCOUNTER — Encounter
Admission: RE | Disposition: A | Discharge status: Home / Self Care | Payer: Self-pay | Source: Ambulatory Visit | Attending: Ophthalmology

## 2016-03-30 ENCOUNTER — Ambulatory Visit: Payer: Self-pay | Admitting: Ophthalmology

## 2016-03-30 ENCOUNTER — Ambulatory Visit
Admission: RE | Admit: 2016-03-30 | Discharge: 2016-03-30 | Disposition: A | Discharge status: Home / Self Care | Payer: Self-pay | Source: Ambulatory Visit | Attending: Ophthalmology | Admitting: Ophthalmology

## 2016-03-30 DIAGNOSIS — Z961 Presence of intraocular lens: Secondary | ICD-10-CM

## 2016-03-30 HISTORY — PX: PR XCAPSL CTRC RMVL INSJ IO LENS PROSTH W/O ECP: 66984

## 2016-03-30 SURGERY — PHACOEMULSIFICATION, CATARACT, EXTRACAPSULAR
Anesthesia: Monitor Anesthesia Care | Site: Eye | Laterality: Right | Wound class: Clean

## 2016-03-30 MED ORDER — MOXIFLOXACIN HCL 0.5 % OP SOLN *I*
OPHTHALMIC | Status: DC | PRN
Start: 2016-03-30 — End: 2016-03-30
  Administered 2016-03-30: 2 [drp] via TOPICAL

## 2016-03-30 MED ORDER — KETOROLAC TROMETHAMINE 30 MG/ML IJ SOLN *I*
INTRAMUSCULAR | Status: DC | PRN
Start: 2016-03-30 — End: 2016-03-30
  Administered 2016-03-30: 15 mg via INTRAVENOUS

## 2016-03-30 MED ORDER — CYCLOPENTOLATE-PHENYLEPHRINE-MOXIFLOXACIN-TROPICAMIDE (VIGAMOX POWER) *I*
OPHTHALMIC | Status: AC
Start: 2016-03-30 — End: 2016-03-30
  Filled 2016-03-30: qty 0.5

## 2016-03-30 MED ORDER — SODIUM CHLORIDE 0.9 % IV SOLN WRAPPED *I*
20.0000 mL/h | Status: DC
Start: 2016-03-30 — End: 2016-03-30

## 2016-03-30 MED ORDER — ONDANSETRON HCL 2 MG/ML IV SOLN *I*
INTRAMUSCULAR | Status: DC | PRN
Start: 2016-03-30 — End: 2016-03-30
  Administered 2016-03-30: 4 mg via INTRAMUSCULAR

## 2016-03-30 MED ORDER — FLURBIPROFEN SODIUM 0.03 % OP SOLN *I*
1.0000 [drp] | Freq: Once | OPHTHALMIC | Status: AC
Start: 2016-03-30 — End: 2016-03-30
  Administered 2016-03-30: 1 [drp] via OPHTHALMIC

## 2016-03-30 MED ORDER — POVIDONE-IODINE 5 % OP SOLN *I*
OPHTHALMIC | Status: DC | PRN
Start: 2016-03-30 — End: 2016-03-30
  Administered 2016-03-30: 30 mL via TOPICAL

## 2016-03-30 MED ORDER — CYCLOPENTOLATE-PHENYLEPHRINE-MOXIFLOXACIN-TROPICAMIDE (VIGAMOX POWER) *I*
1.0000 [drp] | OPHTHALMIC | Status: AC
Start: 2016-03-30 — End: 2016-03-30
  Administered 2016-03-30 (×3): 1 [drp] via OPHTHALMIC

## 2016-03-30 MED ORDER — LACTATED RINGERS IV SOLN *I*
20.0000 mL/h | INTRAVENOUS | Status: DC
Start: 2016-03-30 — End: 2016-03-30
  Administered 2016-03-30: 20 mL/h via INTRAVENOUS

## 2016-03-30 MED ORDER — FENTANYL CITRATE 50 MCG/ML IJ SOLN *WRAPPED*
INTRAMUSCULAR | Status: DC | PRN
Start: 2016-03-30 — End: 2016-03-30
  Administered 2016-03-30 (×2): 50 ug via INTRAVENOUS

## 2016-03-30 MED ORDER — LIDOCAINE HCL 1 % IJ SOLN *I*
INTRAMUSCULAR | Status: DC | PRN
Start: 2016-03-30 — End: 2016-03-30
  Administered 2016-03-30: 1 mL via INTRAOCULAR

## 2016-03-30 MED ORDER — MIDAZOLAM HCL 1 MG/ML IJ SOLN *I* WRAPPED
INTRAMUSCULAR | Status: AC
Start: 2016-03-30 — End: 2016-03-30
  Filled 2016-03-30: qty 2

## 2016-03-30 MED ORDER — CEFUROXIME 10 MG/ML INTRACAMERAL INJ *I*
INTRACAMERAL | Status: DC | PRN
Start: 2016-03-30 — End: 2016-03-30
  Administered 2016-03-30: 0.1 mL via INTRACAMERAL

## 2016-03-30 MED ORDER — ACETAMINOPHEN 325 MG PO TABS *I*
650.0000 mg | ORAL_TABLET | ORAL | Status: DC | PRN
Start: 2016-03-30 — End: 2016-03-31

## 2016-03-30 MED ORDER — KETOROLAC TROMETHAMINE 0.5 % OP SOLN *I*
1.0000 [drp] | Freq: Four times a day (QID) | OPHTHALMIC | 0 refills | Status: DC
Start: 2016-04-02 — End: 2016-05-03

## 2016-03-30 MED ORDER — MIDAZOLAM HCL 1 MG/ML IJ SOLN *I* WRAPPED
INTRAMUSCULAR | Status: DC | PRN
Start: 2016-03-30 — End: 2016-03-30
  Administered 2016-03-30 (×2): 1 mg via INTRAVENOUS

## 2016-03-30 MED ORDER — LIDOCAINE HCL 1 % IJ SOLN *I*
0.1000 mL | INTRAMUSCULAR | Status: DC | PRN
Start: 2016-03-30 — End: 2016-03-30

## 2016-03-30 MED ORDER — TIMOLOL MALEATE 0.5 % OP SOLN *I*
OPHTHALMIC | Status: DC | PRN
Start: 2016-03-30 — End: 2016-03-30
  Administered 2016-03-30: 1 [drp] via TOPICAL

## 2016-03-30 MED ORDER — PREDNISOLONE ACETATE 1 % OP SUSP *I*
1.0000 [drp] | OPHTHALMIC | 0 refills | Status: DC
Start: 2016-03-30 — End: 2016-05-03

## 2016-03-30 MED ORDER — FLURBIPROFEN SODIUM 0.03 % OP SOLN *I*
OPHTHALMIC | Status: AC
Start: 2016-03-30 — End: 2016-03-30
  Filled 2016-03-30: qty 2.5

## 2016-03-30 MED ORDER — MOXIFLOXACIN HCL 0.5 % OP SOLN *I*
1.0000 [drp] | OPHTHALMIC | 0 refills | Status: DC
Start: 2016-03-30 — End: 2016-05-03

## 2016-03-30 MED ORDER — EPINEPHRINE 1 MG/ML IJ SOLUTION WRAPPED *I*
INTRAOCULAR | Status: DC | PRN
Start: 2016-03-30 — End: 2016-03-30
  Administered 2016-03-30: 480 mL

## 2016-03-30 MED ORDER — FENTANYL CITRATE 50 MCG/ML IJ SOLN *WRAPPED*
INTRAMUSCULAR | Status: AC
Start: 2016-03-30 — End: 2016-03-30
  Filled 2016-03-30: qty 2

## 2016-03-30 MED ORDER — ONDANSETRON HCL 2 MG/ML IV SOLN *I*
4.0000 mg | Freq: Once | INTRAMUSCULAR | Status: AC | PRN
Start: 2016-03-30 — End: 2016-03-30

## 2016-03-30 SURGICAL SUPPLY — 26 items
BAG BSS CENTURION 500ML (Drug) ×3 IMPLANT
CARTRIDGE D MONARCH III F/IOL (Supply) ×3 IMPLANT
COVER MAYO STAND (Drape) ×2
COVER MAYO STD W24XL53IN 3 LAYR SMS RECOIL TECH DISP (Drape) ×1 IMPLANT
DRAPE INSTRUMENT CLEAR ADHESIVE (Drape) ×3 IMPLANT
DRAPE SURG INCISE XL 51 X 51IN STER (Drape) ×3 IMPLANT
GLOVE BIOGEL PI MICRO SZ 7.5 (Glove) ×9 IMPLANT
HANPIECE POLYMER I/A TRANSFORMER (Supply) IMPLANT
KNIFE ANGLED SLIT 2.8MM DISCONTINUED (Other) IMPLANT
KNIFE CLEARCUT ANGL BVL 2.4MM (Other) ×3 IMPLANT
KNIFE MVR EDGE AHEAD (Supply) ×3 IMPLANT
LENS CONTACT DAY/NIGHT AIR OPTIX (Supply) IMPLANT
LENS POST SN60WF +15.5 (Implant) ×2 IMPLANT
NEEDLE HYPO REG BVL 18G X 1.5IN PINK (Needle) ×3 IMPLANT
PACK CENTUR FMS ULTRA BAL 45DEG BEVEL UP REPLACED WITH 299597 (Other) ×3
PACK CUSTOM EYE PACK (Other) ×3 IMPLANT
PACK LIQUID OPTIC INTERFACE BULK STER (Other) IMPLANT
RING MALYUGIN 2.0 7.0MM (Supply) IMPLANT
SEALANT LUBRICATING IORT MGMT OF CLR CORNEAL INCISIONS RESURE (Other) IMPLANT
SET DISPOSABLE BIMANUAL POLYMER I/A (Other) IMPLANT
SOL PROVISC OPTHALMIC 1PCT .85ML (Drug) ×3 IMPLANT
SOL VISCOAT .75ML (Supply) ×2
SOLUTION VISCOELASTIC 0.75ML 40-30MG/ML SOD CHONDROITIN SULF SOD HYALURONATE IO PREFIL SYR (Supply) ×1 IMPLANT
SYRINGE TB SAFETYGLIDE 1CC 27G X 1/2IN (Supply) ×3 IMPLANT
SYSTEM FLD MGMT DIA0.9MM 45DEG ULT BAL VISN ACT INTREPID CENTURION (Other) ×1 IMPLANT
TIP POLYMER I/A 0.3MM (Supply) ×3 IMPLANT

## 2016-03-30 NOTE — Anesthesia Postprocedure Evaluation (Signed)
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]) Ladora DanielKlein, Christian, MD Tilden FossaSmith, Liberti Appleton I, MD        Recovery Vitals  BP: 112/57 (03/30/2016  9:45 AM)  Heart Rate: 74 (03/30/2016  8:08 AM)  Heart Rate (via Pulse Ox): 73 (03/30/2016  9:45 AM)  Resp: 18 (03/30/2016  9:45 AM)  Temp: 36.3 C (97.3 F) (03/30/2016  9:45 AM)  SpO2: 98 % (03/30/2016  9:45 AM)   0-10 Scale: 0 (03/30/2016  9:45 AM)  Anesthesia type:  Monitor Anesthesia Care  Complications Noted During Procedure or in PACU:  None   Comment:    Patient Location:  PACU  Level of Consciousness:    Recovered to baseline, awake, alert and oriented  Patient Participation:     Able to participate  Temperature Status:    Normothermic  Oxygen Saturation:    Within patient's normal range  Cardiac Status:   Within patient's normal range  Fluid Status:    Stable  Airway Patency:     Yes  Pulmonary Status:    Baseline  Pain Management:    Adequate analgesia  Nausea and Vomiting:  None    Post Op Assessment:    Tolerated procedure well   Attending Attestation:  All indicated post anesthesia care provided     -

## 2016-03-30 NOTE — Discharge Instructions (Signed)
Post Operative Discharge Instructions    Name: Miranda Bonnetnn [name]  DOB: [birth year]     Age: 71 y.o.  MRN: [id number]    Date: 03/30/2016    Procedure: cataract extraction with placement of intraocular lens, right eye    You have received sedative medications and/or general anesthesia which may make you drowsy for as long as 24 hours:       -  DO NOT drive or operate any machinery for 24 hours       -  DO NOT drink alcoholic beverages for 24 hours       -  DO NOT make major decisions, sign contracts, etc. for 24 hours.    Please continue to adhere to these precautions if you are taking narcotic medication.    Discomfort after surgery       -  A scratchy feeling or occasional sharp pain is normal       -  If you have a severe headache, nausea, or uncontrolled pain, call your doctor at [phone].    Eye care after surgery       -  Remove eye shield in 3 hours     AT 12:30 PM       -  After removing the eye shield you can wear provided sunglasses for protection and light sensitivity during waking hours.  Replace the eye shield at bedtime for protection.       -  Keep the eye dry       -  Redness is common and gradually diminshes over time. Blood stained tears can occur and are also normal.    Activity after surgery       -  You must avoid lifting heavy objects and bending over       -  You may read and watch TV       -  You may drive when your doctor gives you permission    Diet after surgery       -  Resume previous diet    Medications       -  Please resume all of your usual medications       -  After removing the eye shield, begin taking the following medications to the right eye:  AT 12:30 PM    - Prednisolone acetate (Pred Forte) 1% one drop every 2 hours while awake    - Moxifloxacin (Vigamox) 0.5% one drop every 2 hours while awake       -  You have been given a prescription for Ketorolac (Acular) 0.5%.  In 3 days, begin taking Ketorolac (Acular) 0.5% one drop 4 times daily to the right eye       - Please wait 5  minutes in between drops.      -  You may take acetaminophen (Tylenol) as needed for pain    Ibuprofen/Advil/Motrin available to take after 3:15 PM    Your follow-up appointment is on 03/31/16 at 8:00am with Dr. Graciela HusbandsKlein  Location:  [facility]    [facility]    [address]    [place], [place] [id number]         -  If you have an emergency and are unable to contact your doctor at the [facility] [phone] or Ophthalmology after hours answering service [phone]), call the Emergency Department [phone]).    Authored by Boyce MediciBrittany Simmons, MD on 03/30/2016 at 9:48 AM

## 2016-03-30 NOTE — INTERIM OP NOTE (Signed)
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      Implant LENS POST SN60WF +15.5 - [id number] Implanted [id number]                Packing:                 Patient Condition: good       Findings (Including unexpected complications): Visually significant cataract, right eye, now status post cataract extraction.  Please see operative note for details.       Signed:  Boyce MediciBrittany Aaryn Sermon, MD  on 03/30/2016 at 9:46 AM

## 2016-03-30 NOTE — Anesthesia Case Conclusion (Signed)
CASE CONCLUSION  [facility]  Transport  Directly to: ASC  Position:  Recumbent  Patient Condition on Handoff  Level of Consciousness:  Mildly sedated  Patient Condition:  Stable  Handoff Report to:  RN

## 2016-03-30 NOTE — H&P (Signed)
UPDATES TO PATIENT'S CONDITION on the DAY OF SURGERY/PROCEDURE    I. Updates to Patient's Condition (to be completed by a provider privileged to complete a H&P, following reassessment of the patient by the provider):    Day of Surgery/Procedure Update:  History  History reviewed and no change    Physical  Physical exam updated and no change            II. Procedure Readiness   I have reviewed the patient's H&P and updated condition. By completing and signing this form, I attest that this patient is ready for surgery/procedure.    III. Attestation   I have reviewed the updated information regarding the patient's condition and it is appropriate to proceed with the planned surgery/procedure.    Ladora Danielhristian Arcelia Pals, MD as of 9:13 AM 03/30/2016

## 2016-03-30 NOTE — Op Note (Addendum)
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      Implant LENS POST SN60WF +15.5 - [id number] Implanted [id number]              Description of Procedure:     Prior to the day of surgery, the patient was informed of the risks, benefits, and alternatives of cataract surgery and written informed consent was obtained. On the day of surgery, the patient was met in the pre-operative staging area, questions were answered, and the correct operative eye, being the right eye, was marked.  The eye was instilled with a drop of betadine and a ribbon of lidocaine jelly in the superior and inferior fornices prior to transfer to the operating room.    The patient was then taken to the operating suite and placed in the supine position. Cardiac monitors were placed. A time out was called and all members of the operating team confirmed that the right eye was the correct operative eye, and that cataract surgery was the correct procedure. Monitored anesthesia care was provided by the anesthesia service. The patient was prepped and draped in a sterile ophthalmic fashion. A lid  speculum was placed in the eye.    A 1 mm paracentesis incision was made in the cornea for the left hand and non-preserved lidocaine was injected into the anterior chamber.   Next, a dispersive viscoelastic was used to expand the anterior chamber.   A 2.4 mm keratome incision was then made temporally at the limbus.     A continuous curvilinear capsulorhexis was initiated with the cystitome followed by injection of Provisc. The rhexis was completed with the micro-Utrada forceps.  Hydrodissection was performed with BSS on a chang cannula and the lens was noted to freely rotate. Next the phacoemulsification handpiece was inserted into the eye and the lens was removed with a chop technique. Irrigation and aspiration was used to remove residual cortex. The capsular bag was filled with cohesive viscoelastic and the SN60WF lens of power +15.5 D and serial number [id number] was injected into the capsular bag. The trailing haptic was placed under the anterior capsule using a second instrument.  The I/A hand piece was used to remove residual  viscoelastic. 0.1 ml of Cefuroxime was injected into the anterior chamber.  The wounds were hydrated and found to be water tight. The intraocular pressure of the eye was checked with palpation and found to be normal.     The lid speculum was removed and the 2 drops of Vigamox and 1 drop of 0.5% Timolol were placed on the ocular surface. The eye was cleaned and a clear plastic shield was placed over the eye. The patient was brought to the recovery room in good condition. The patient was discharged home and will follow up with Dr. Caryl Comes at the [facility] tomorrow.          Signed:  Ellen Henri, MD  on 03/30/2016 at 9:47 AM     I was present throughout the entire procedure.    Corine Shelter, MD

## 2016-03-30 NOTE — Anesthesia Procedure Notes (Signed)
---------------------------------------------------------------------------------------------------------------------------------------    AIRWAY   GENERAL INFORMATION AND STAFF    Patient location during procedure: OR       Date of Procedure: 03/30/2016 9:26 AM  CONDITION PRIOR TO MANIPULATION     Current Airway/Neck Condition:  Normal        For more airway physical exam details, see Anesthesia PreOp Evaluation  FINAL AIRWAY DETAILS    Final Airway Type:  Nasal cannula    Head position required to avoid obstruction:  Neutral    Insertion Site:  Left naris and right naris  ----------------------------------------------------------------------------------------------------------------------------------------

## 2016-03-31 ENCOUNTER — Encounter: Payer: Self-pay | Admitting: Ophthalmology

## 2016-03-31 ENCOUNTER — Ambulatory Visit: Payer: Self-pay | Admitting: Ophthalmology

## 2016-03-31 DIAGNOSIS — H35361 Drusen (degenerative) of macula, right eye: Secondary | ICD-10-CM

## 2016-03-31 DIAGNOSIS — H43813 Vitreous degeneration, bilateral: Secondary | ICD-10-CM

## 2016-03-31 DIAGNOSIS — Z961 Presence of intraocular lens: Secondary | ICD-10-CM

## 2016-03-31 NOTE — Progress Notes (Signed)
Outpatient Visit      Patient name: [name]  DOB: 1945/02/25       Age: 71 y.o.  MR#: [id number]    Encounter Date: 03/31/2016    Subjective:      Chief Complaint   Patient presents with    Post Cataract Surgery     1 day , right eye - second eye      HPI     Post Cataract Surgery    Additional comments: 1 day , right eye - second eye            Comments   [name] is a 71 y.o. female who returns 1 day post CE with   monofocal IOL OD, second eye .   She has been doing well, no concerns today.    Oc meds: OD: vig/pred q2hr  OS pred/ketorolac BID OS       Last edited by Miranda Daniel, MD on 03/31/2016  8:28 AM. (History)        has a current medication list which includes the following prescription(s): moxifloxacin, prednisolone acetate, moxifloxacin, prednisolone acetate, ketorolac, multiple vitamins-minerals, calcium carbonate-vitamin d, fish oil pearls, multivitamin, and ketorolac.     is allergic to sevoflurane and succinylcholine.      Past Medical History:   Diagnosis Date    Tibia/fibula fracture 04/21/2012      Past Surgical History:   Procedure Laterality Date    CONVERTED PROCEDURE      Filleted Finger Flap     orif  2014    left ankle    PR REMV CATARACT EXTRACAP,INSERT LENS Left 03/16/2016    Procedure: PHACO W/ IOL;  Surgeon: Miranda Daniel, MD;  Location: [facility] MAIN OR;  Service: Ophthalmology    TONSILLECTOMY      TONSILLECTOMY AND ADENOIDECTOMY          Specialty Problems        Ophthalmology Problems    Extramacular Drusen, right eye        PVD (posterior vitreous detachment), bilateral        Refractive error        Pseudophakia of left eye (SN60WF +15.5D 03/16/2016)        Pseudophakia of right eye (SN60WF +15.5D 03/30/16)               ROS     Positive for: Eyes    Negative for: Constitutional, Gastrointestinal, Neurological, Skin,   Genitourinary, Musculoskeletal, HENT, Endocrine, Cardiovascular,   Respiratory, Psychiatric, Allergic/Imm, Heme/Lymph    Last edited by [name],  Miranda on 03/31/2016  7:56 AM. (History)         Objective:     Base Eye Exam     Visual Acuity (Snellen - Linear)      Right Left   Dist sc 20/50 +2 20/30   Dist ph sc 20/40          Tonometry (Tonopen, 8:05 AM)      Right Left   Pressure 16 14         Neuro/Psych     Oriented x3:  Yes    Mood/Affect:  Normal            Slit Lamp and Fundus Exam     External Exam      Right Left    External Normal ocular adnexae, lacrimal gland & drainage, orbits Normal ocular adnexae, lacrimal gland & drainage, orbits      Slit Lamp Exam  Right Left    Lids/Lashes Normal structure & position Normal structure & position    Conjunctiva/Sclera Normal bulbar/palpebral, conjunctiva, sclera Normal bulbar/palpebral, conjunctiva, sclera    Cornea Normal epithelium, stroma, endothelium, tear film TCCI    Anterior Chamber tr cell Clear & deep    Iris moderate dilation moderate dilation    Lens PCIOL PCIOL    Vitreous PVD PVD      Fundus Exam      Right Left    Disc Normal size, appearance, nerve fiber layer Normal size, appearance, nerve fiber layer    C/D Ratio 0.3 0.3    Macula Normal Normal    Vessels Normal Normal    Periphery Drusen, Extramacular drusen, PVD PVD, No drusen                                  Assessment/Plan:     1. Pseudophakia of right eye (SN60WF +15.5D 03/30/16)     2. Pseudophakia of left eye (SN60WF +15.5D 03/16/2016)     3. PVD (posterior vitreous detachment), bilateral     4. Extramacular Drusen, right eye          PLAN:  1. 1 day s/p cataract extraction/intraocular lens, OD   - Doing well   - Vigamox 1 drop four times daily for 1 week   - Prednisolone acetate 1% 1 drop four times daily   - Acular (ketorolac) 1 drop four times daily - START in 2 DAYS   - Wear eye shield at bedtime for 2 more nights   - Call ASAP for decreasing vision, flashing lights, floaters, or any other concerning symptoms.   - Follow-up 1 week    2. 2 weeks s/p cataract extraction/intraocular lens, OS   - Doing well      - Taper prednisolone  acetate and ketorolac:         - Twice daily x 1 week    - Once daily x 1 week, then stop   - Follow-up urgently for decreasing vision, flashing lights, floaters, or any other concerning symptoms.

## 2016-04-07 ENCOUNTER — Ambulatory Visit: Payer: Self-pay | Admitting: Ophthalmology

## 2016-04-07 ENCOUNTER — Encounter: Payer: Self-pay | Admitting: Ophthalmology

## 2016-04-07 DIAGNOSIS — Z961 Presence of intraocular lens: Secondary | ICD-10-CM

## 2016-04-07 DIAGNOSIS — H35361 Drusen (degenerative) of macula, right eye: Secondary | ICD-10-CM

## 2016-04-07 DIAGNOSIS — H43813 Vitreous degeneration, bilateral: Secondary | ICD-10-CM

## 2016-04-07 NOTE — Progress Notes (Signed)
Outpatient Visit      Patient name: [name]  DOB: May 27, 1945       Age: 71 y.o.  MR#: [id number]    Encounter Date: 04/07/2016    Subjective:      Chief Complaint   Patient presents with    Post-op     1 wk OD 2nd eye     HPI     Post-op    Additional comments: 1 wk OD 2nd eye           Comments   [name] is a 71 y.o. female  Cc: Pt states VA's are better, Pt states no flashes no floaters no pain.   Pt states she is complaint with drops.   Hx: PCIOL OU, PVD, Drusen of mac  Eye Meds: PF 3x's a day, Ket 3x's a day OD         Last edited by Corine Shelter, MD on 04/07/2016  8:27 AM. (History)        has a current medication list which includes the following prescription(s): prednisolone acetate, ketorolac, prednisolone acetate, ketorolac, multiple vitamins-minerals, calcium carbonate-vitamin d, fish oil pearls, multivitamin, moxifloxacin, and moxifloxacin.     is allergic to sevoflurane and succinylcholine.      Past Medical History:   Diagnosis Date    Tibia/fibula fracture 04/21/2012      Past Surgical History:   Procedure Laterality Date    CONVERTED PROCEDURE      Filleted Finger Flap     orif  2014    left ankle    PR REMV CATARACT EXTRACAP,INSERT LENS Left 03/16/2016    Procedure: PHACO W/ IOL;  Surgeon: Corine Shelter, MD;  Location[facility] MAIN OR;  Service: Ophthalmology    PR [place] CATARACT EXTRACAP,INSERT LENS Right 03/30/2016    Procedure: PHACO W/ IOL;  Surgeon: Corine Shelter, MD;  Location: [facility] MAIN OR;  Service: Ophthalmology    TONSILLECTOMY      TONSILLECTOMY AND ADENOIDECTOMY          Specialty Problems        Ophthalmology Problems    Extramacular Drusen, right eye        PVD (posterior vitreous detachment), bilateral        Refractive error        Pseudophakia of left eye (SN60WF +15.5D 03/16/2016)        Pseudophakia of right eye (SN60WF +15.5D 03/30/16)               ROS     Positive for: Eyes    Negative for: Constitutional, Gastrointestinal, Neurological, Skin,      Genitourinary, Musculoskeletal, HENT, Endocrine, Cardiovascular,   Respiratory, Psychiatric, Allergic/Imm, Heme/Lymph    Last edited by Caroline More on 04/07/2016  8:10 AM. (History)         Objective:     Base Eye Exam     Visual Acuity (Snellen - Linear)      Right Left   Dist sc 20/40 20/30 -1   Dist ph sc NI          Tonometry (Tonopen, 8:21 AM)      Right Left   Pressure 17 18         Pupils      APD   Right None   Left None         Extraocular Movement      Right Left   Result Full Full  Neuro/Psych     Oriented x3:  Yes    Mood/Affect:  Normal      Dilation     Right eye:  1.0% Tropic[place] @ 8:21 AM            Slit Lamp and Fundus Exam     External Exam      Right Left    External Normal ocular adnexae, lacrimal gland & drainage, orbits Normal ocular adnexae, lacrimal gland & drainage, orbits      Slit Lamp Exam      Right Left    Lids/Lashes Normal structure & position Normal structure & position    Conjunctiva/Sclera Normal bulbar/palpebral, conjunctiva, sclera Normal bulbar/palpebral, conjunctiva, sclera    Cornea Normal epithelium, stroma, endothelium, tear film TCCI    Anterior Chamber few cells Clear & deep    Iris moderate dilation moderate dilation    Lens PCIOL PCIOL    Vitreous PVD PVD      Fundus Exam      Right Left    Disc Normal size, appearance, nerve fiber layer Normal size, appearance, nerve fiber layer    C/D Ratio 0.3 0.3    Macula Normal Normal    Vessels Normal Normal    Periphery Drusen, Extramacular drusen, PVD PVD, No drusen            Refraction     Manifest Refraction      Sphere Cylinder Axis Dist   Right -1.50 +0.50 010 20/20-1   Left                                         Assessment/Plan:     1. Pseudophakia of right eye (SN60WF +15.5D 03/30/16)     2. Pseudophakia of left eye (SN60WF +15.5D 03/16/2016)     3. PVD (posterior vitreous detachment), bilateral     4. Extramacular Drusen, right eye          PLAN:  1. 1 week s/p cataract extraction/intraocular lens, OD   -  Doing well   - Stop Vigamox   - Taper prednisolone acetate and ketorolac:         - Twice daily x 1 week    - Once daily x 1 week, then stop   - Follow-up 3-4 weeks or sooner for decreasing vision, flashing lights, floaters, or any other concerning symptoms.   - Plan for refraction and dilation at next visit     2. 3 weeks s/p cataract extraction/intraocular lens, OS   - Doing well      - Taper prednisolone acetate and ketorolac:         - STOP     - Follow-up urgently for decreasing vision, flashing lights, floaters, or any other concerning symptoms.   - Refraction next visit

## 2016-04-30 ENCOUNTER — Other Ambulatory Visit: Payer: Self-pay | Admitting: Gastroenterology

## 2016-04-30 ENCOUNTER — Telehealth: Payer: Self-pay | Admitting: Primary Care

## 2016-04-30 NOTE — Telephone Encounter (Signed)
FYI- [facility]

## 2016-05-03 ENCOUNTER — Other Ambulatory Visit: Payer: Self-pay | Admitting: Primary Care

## 2016-05-03 ENCOUNTER — Encounter: Payer: Self-pay | Admitting: Primary Care

## 2016-05-03 ENCOUNTER — Ambulatory Visit: Payer: Medicare (Managed Care) | Attending: Primary Care | Admitting: Primary Care

## 2016-05-03 VITALS — BP 122/70 | HR 68 | Wt 139.0 lb

## 2016-05-03 DIAGNOSIS — M8580 Other specified disorders of bone density and structure, unspecified site: Secondary | ICD-10-CM

## 2016-05-03 DIAGNOSIS — Z0000 Encounter for general adult medical examination without abnormal findings: Secondary | ICD-10-CM

## 2016-05-03 DIAGNOSIS — R079 Chest pain, unspecified: Secondary | ICD-10-CM

## 2016-05-03 NOTE — Telephone Encounter (Signed)
[facility] today at 1:00 with MH

## 2016-05-03 NOTE — Progress Notes (Addendum)
Progress Note    Reason For Visit:   Chief Complaint   Patient presents with    Follow-up       Subjective:      [name] is 71 y.o. year old female with a hx of osteopenia who presents to clinic for hospital follow up of chest pain.    Hospital Follow Up Of Chest Pain - She presented to [facility] ED on 3/9 for chest pain. Records reviewed brief summary to follow. She woke up on Friday AM with a tightness below her bra line. She took ASA 325 mg w/o improvement in symptoms. She went to the ED for further evaluation. . Labs notable for Troponin I <0.01 x2, Hgb/Hct- 13.2/40, and TSH-8.43 but 1.24 on repeat. CXR wnls. ECG showed normal sinus rhythm. Stress echo showed no echocardiographic evidence of ischemia at workload achieved.     Chest Pain - Started Friday AM. It lasted 5 hours. She describes it as a tightness underneath the bra line. Sitting made the pain worst. ASA did not help. She has not had chest pain since that time. It did not feel like prior indigestion. No associated shortness of breath, nausea, extremity paresthesias, or jaw pain. No calf tenderness. No fevers. On Thursday, she moved furniture for cleaning. She had a burger at [employer]. Had tomatoes but no Ketchup. No new bras.       PMHx:  No hx of CAD    Family Hx:  Mom - a. Fib  Dad - A. Fib and heart failure       Problem List was reviewed in e-record today.   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        Medications were reviewed and reconciled today. Appropriate changes were made.    Allergies:     Allergies   Allergen Reactions    Sevoflurane Other (See Comments)     Family history of malignant hyperthermia    Succinylcholine Other (See Comments)     Family history of malignant hyperthermia       Allergies were reviewed with patient today.  Review of Systems:   ROS . See HPI    Vitals:     Vitals:    05/03/16 1302  BP: 122/70   Pulse: 68   Weight: 63 kg (139 lb)     Wt Readings from Last 3 Encounters:   05/03/16 63 kg (139 lb)   03/30/16 63.8 kg (140 lb 10.5 oz)   03/16/16 63.2 kg (139 lb 5.3 oz)     BP Readings from Last 3 Encounters:   05/03/16 122/70   03/30/16 112/57   03/24/16 128/67       Physical Exam    General: Appears stated age.  In no apparent distress.  See VS.    Pulmonary: Good effort. CTA B/L. No wheezes, rhonchi, or rales.  Cardiovascular: RRR. No murmurs, rubs, or gallops. No LE edema.  Abdominal: Soft. Non-distended. Non-tender. Bowel sounds present.  No pain w/ resisted shoulder flexion, extension, abduction, or adduction.       Orders:   No orders of the defined types were placed in this encounter.      Assessment and Plan:     71 y.o. y/o female  with a hx of osteopenia who presents to clinic for hospital follow up of chest pain.    Chest Pain  Unclear etiology but unlikely cardiac  GERD, esophogeal spasm, and muscle spasm are highest on my differential  Troponin I <  0.01 x 2, ECG wnls, CXR wnls, Stress echo wnls   No further workup needed at this time  If recurs, I recommended taking ranitidine 150 mg and calling our office. She agreed.     Follow up as previously scheduled.     Brayton MarsMark Harneet Noblett, MD 05/03/2016 1:23 PM  [facility]

## 2016-05-03 NOTE — Telephone Encounter (Signed)
Report put in dr. Yetta FlockHodges office

## 2016-05-05 ENCOUNTER — Encounter: Payer: Self-pay | Admitting: Ophthalmology

## 2016-05-05 ENCOUNTER — Ambulatory Visit: Payer: Medicare (Managed Care) | Attending: Primary Care | Admitting: Ophthalmology

## 2016-05-05 DIAGNOSIS — H35361 Drusen (degenerative) of macula, right eye: Secondary | ICD-10-CM

## 2016-05-05 DIAGNOSIS — Z961 Presence of intraocular lens: Secondary | ICD-10-CM

## 2016-05-05 NOTE — Progress Notes (Signed)
Outpatient Visit      Patient name: [name]  DOB: 24-Jun-1945       Age: 71 y.o.  MR#: [id number]    Encounter Date: 05/05/2016    Subjective:      Chief Complaint   Patient presents with    Post-op     MRx     HPI     Post-op    Additional comments: MRx           Comments   [name] is a 71 y.o. female  Cc: Pt states VA's are good, Pt states no flashes no floaters no pain. Pt   states she is done with drops.   Hx: PCIOL OU  Eye Meds: none       Last edited by Ladora DanielKlein, Maanav Kassabian, MD on 05/05/2016  8:45 AM. (History)        has a current medication list which includes the following prescription(s): multiple vitamins-minerals, calcium carbonate-vitamin d, fish oil pearls, and multivitamin.     is allergic to sevoflurane and succinylcholine.      Past Medical History:   Diagnosis Date    Tibia/fibula fracture 04/21/2012      Past Surgical History:   Procedure Laterality Date    CONVERTED PROCEDURE      Filleted Finger Flap     orif  2014    left ankle    PR REMV CATARACT EXTRACAP,INSERT LENS Left 03/16/2016    Procedure: PHACO W/ IOL;  Surgeon: Ladora DanielKlein, Palmyra Rogacki, MD;  [facility];  Service: Ophthalmology    PR REMV CATARACT EXTRACAP,INSERT LENS Right 03/30/2016    Procedure: PHACO W/ IOL;  Surgeon: Ladora DanielKlein, Lariza Cothron, MD;  [facility];  Service: Ophthalmology    TONSILLECTOMY      TONSILLECTOMY AND ADENOIDECTOMY          Specialty Problems        Ophthalmology Problems    Extramacular Drusen, right eye        PVD (posterior vitreous detachment), bilateral        Refractive error        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 (History)          Objective:     Base Eye Exam     Visual Acuity (Snellen - Linear)      Right Left   Dist sc 20/30 -2 20/25 -2         Tonometry (Tonopen, 8:41 AM)      Right Left   Pressure 15 16         Pupils      Pupils APD   Right PERRLA None   Left PERRLA None         Extraocular Movement      Right Left   Result Full Full         Neuro/Psych     Oriented x3:  Yes    Mood/Affect:  Normal            Slit Lamp and Fundus Exam  External Exam      Right Left    External Normal ocular adnexae, lacrimal gland & drainage, orbits Normal ocular adnexae, lacrimal gland & drainage, orbits      Slit Lamp Exam      Right Left    Lids/Lashes Normal structure & position Normal structure & position    Conjunctiva/Sclera Normal bulbar/palpebral, conjunctiva, sclera Normal bulbar/palpebral, conjunctiva, sclera    Cornea TCCI; mild spk TCCI; mild spk    Anterior Chamber Clear & deep Clear & deep    Iris Normal shape, size, morphology Normal shape, size, morphology    Lens PCIOL PCIOL    Vitreous PVD PVD      Fundus Exam      Right Left    Disc Normal size, appearance, nerve fiber layer Normal size, appearance, nerve fiber layer    C/D Ratio 0.3 0.3    Macula Normal Normal    Vessels Normal Normal    Periphery Drusen, Extramacular drusen, PVD PVD, No drusen            Refraction     Manifest Refraction      Sphere Cylinder Axis Dist Add   Right -1.00 +0.25 020 20/20 +2.50   Left -0.75 +0.25 165 20/20 +2.50         Final Rx      Sphere Cylinder Axis Add   Right -1.00 +0.25 020 +2.50   Left -0.75 +0.25 165 +2.50                 Final Rx      Sphere Cylinder Axis Add   Right -1.00 +0.25 020 +2.50   Left -0.75 +0.25 165 +2.50                             Assessment/Plan:     1. Pseudophakia of left eye (SN60WF +15.5D 03/16/2016)     2. Pseudophakia of right eye (SN60WF +15.5D 03/30/16)     3. Extramacular Drusen, right eye          PLAN:    1,2.  1 month s/p cataract extraction/intraocular lens, OU   - Doing well, prescribed glasses as  above   - Taper and stop prednisolone acetate 1%     - Patient to call for floaters, flashing lights, decreasing vision, or any other concerning symptoms.   - Follow-up 6 months, sooner for any concerns    3. Seen by Dr. Johnnette Litter 2017.  Not significant            Findings and plan were discussed with pt, who verbalized understanding and agreement.  To call if questions or concerns: [phone], number provided.

## 2016-08-18 ENCOUNTER — Other Ambulatory Visit
Admission: RE | Admit: 2016-08-18 | Discharge: 2016-08-18 | Disposition: A | Discharge status: Home / Self Care | Payer: Medicare (Managed Care) | Source: Ambulatory Visit | Attending: Primary Care | Admitting: Primary Care

## 2016-08-18 DIAGNOSIS — Z0000 Encounter for general adult medical examination without abnormal findings: Secondary | ICD-10-CM | POA: Insufficient documentation

## 2016-08-18 DIAGNOSIS — M8580 Other specified disorders of bone density and structure, unspecified site: Secondary | ICD-10-CM

## 2016-08-18 LAB — HEMOGLOBIN A1C: Hemoglobin A1C: 5.5 % (ref 4.0–6.0)

## 2016-08-18 LAB — CBC AND DIFFERENTIAL
Baso # K/uL: 0 10*3/uL (ref 0.0–0.1)
Basophil %: 0.7 %
Eos # K/uL: 0.1 10*3/uL (ref 0.0–0.4)
Eosinophil %: 2.3 %
Hematocrit: 41 % (ref 34–45)
Hemoglobin: 13.5 g/dL (ref 11.2–15.7)
IMM Granulocytes #: 0 10*3/uL (ref 0.0–0.1)
IMM Granulocytes: 0.2 %
Lymph # K/uL: 1.5 10*3/uL (ref 1.2–3.7)
Lymphocyte %: 25.7 %
MCH: 31 pg/cell (ref 26–32)
MCHC: 33 g/dL (ref 32–36)
MCV: 92 fL (ref 79–95)
Mono # K/uL: 0.5 10*3/uL (ref 0.2–0.9)
Monocyte %: 9.6 %
Neut # K/uL: 3.5 10*3/uL (ref 1.6–6.1)
Nucl RBC # K/uL: 0 10*3/uL (ref 0.0–0.0)
Nucl RBC %: 0 /100 WBC (ref 0.0–0.2)
Platelets: 306 10*3/uL (ref 160–370)
RBC: 4.4 MIL/uL (ref 3.9–5.2)
RDW: 13.6 % (ref 11.7–14.4)
Seg Neut %: 61.5 %
WBC: 5.6 10*3/uL (ref 4.0–10.0)

## 2016-08-18 LAB — COMPREHENSIVE METABOLIC PANEL
ALT: 22 U/L (ref 0–35)
AST: 21 U/L (ref 0–35)
Albumin: 4.6 g/dL (ref 3.5–5.2)
Alk Phos: 73 U/L (ref 35–105)
Anion Gap: 13 (ref 7–16)
Bilirubin,Total: 0.6 mg/dL (ref 0.0–1.2)
CO2: 28 mmol/L (ref 20–28)
Calcium: 9.9 mg/dL (ref 8.6–10.2)
Chloride: 99 mmol/L (ref 96–108)
Creatinine: 0.92 mg/dL (ref 0.51–0.95)
GFR,Black: 72 *
GFR,Caucasian: 62 *
Glucose: 91 mg/dL (ref 60–99)
Lab: 25 mg/dL — ABNORMAL HIGH (ref 6–20)
Potassium: 5 mmol/L (ref 3.3–5.1)
Sodium: 140 mmol/L (ref 133–145)
Total Protein: 7.2 g/dL (ref 6.3–7.7)

## 2016-08-18 LAB — LIPID PANEL
Chol/HDL Ratio: 2.1
Cholesterol: 198 mg/dL
HDL: 95 mg/dL
LDL Calculated: 92 mg/dL
Non HDL Cholesterol: 103 mg/dL
Triglycerides: 53 mg/dL

## 2016-08-18 LAB — TSH: TSH: 2.46 u[IU]/mL (ref 0.27–4.20)

## 2016-08-20 LAB — VITAMIN D
25-OH VIT D2: <4 ng/mL
25-OH VIT D3: 50 ng/mL
25-OH Vit Total: 50 ng/mL (ref 30–60)

## 2016-08-31 ENCOUNTER — Other Ambulatory Visit: Payer: Self-pay | Admitting: Gastroenterology

## 2016-08-31 LAB — HM MAMMOGRAPHY

## 2016-09-01 ENCOUNTER — Encounter: Payer: Self-pay | Admitting: Primary Care

## 2016-09-01 ENCOUNTER — Ambulatory Visit: Payer: Medicare (Managed Care) | Attending: Primary Care | Admitting: Primary Care

## 2016-09-01 VITALS — BP 122/68 | HR 60 | Ht 59.45 in | Wt 137.0 lb

## 2016-09-01 DIAGNOSIS — L570 Actinic keratosis: Secondary | ICD-10-CM

## 2016-09-01 DIAGNOSIS — M8580 Other specified disorders of bone density and structure, unspecified site: Secondary | ICD-10-CM

## 2016-09-01 DIAGNOSIS — Z91018 Allergy to other foods: Secondary | ICD-10-CM | POA: Insufficient documentation

## 2016-09-01 DIAGNOSIS — Z8489 Family history of other specified conditions: Secondary | ICD-10-CM

## 2016-09-01 DIAGNOSIS — Z86010 Personal history of colonic polyps: Secondary | ICD-10-CM

## 2016-09-01 DIAGNOSIS — Z23 Encounter for immunization: Secondary | ICD-10-CM

## 2016-09-01 DIAGNOSIS — Z87442 Personal history of urinary calculi: Secondary | ICD-10-CM

## 2016-09-01 DIAGNOSIS — Z0000 Encounter for general adult medical examination without abnormal findings: Secondary | ICD-10-CM

## 2016-09-01 DIAGNOSIS — K219 Gastro-esophageal reflux disease without esophagitis: Secondary | ICD-10-CM

## 2016-09-01 LAB — HM HIV SCREENING OFFERED

## 2016-09-01 MED ORDER — PANTOPRAZOLE SODIUM 40 MG PO TBEC *I*
40.0000 mg | DELAYED_RELEASE_TABLET | Freq: Every day | ORAL | 1 refills | Status: DC
Start: 2016-09-01 — End: 2016-10-13

## 2016-09-01 MED ORDER — ZOSTER VAC RECOMB ADJUVANTED 50 MCG IM SUSR *I*
50.0000 ug | Freq: Once | INTRAMUSCULAR | 1 refills | Status: AC
Start: 2016-09-01 — End: 2016-09-01

## 2016-09-01 NOTE — H&P (Signed)
Reason for visit:   CC: [name] is 71 y.o. year old woman coming in for a preventive health care physical  HPI:     Osteopenia - She is taking calcium and vitamin D. She broke her left ankle in 2014. She slipped on ice. She has never been on Fosamax.     Family Hx of Malignant Hyperthermia - She has had anesthesia w/o issue but she has not received sevoflurane or succinylcholine.     Actinic Keratosis - She is followed by Dr. Ardell Isaacs. No concerning skin lesions.     Hx of Nephrolithiasis - No new unilateral back pain. No hematuria.     Chest Pain - She has had three more episodes of pain at the "bra line". She describes it as a tightness across her anterior chest. Two episodes lasted about 1 hour and a 3rd episode lasted about 6 hours. The episode that lasted 6 hours did not improve with taking ranitidine but did improve with PeptoBismol. The episodes that lasted less than an hour improved with ranitidine. No dysphagia, unintentional weight loss, or blood in stool.  The 1st episode occurred in 04/2016. She went to [facility] ED. She ruled out for MI with two negative troponins. She had a normal stress test. The last episode occurred around June 9th. She ate pizza prior to the episode. She has a cough prior to going to bed. It is not productive.       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 03/16/2016) 03/16/2016    Refractive error 01/26/2016    PVD (posterior vitreous detachment), bilateral 01/26/2016    Extramacular Drusen, right eye 01/13/2016     --no AMD  --no need for Preservision    Cataracts OU  --decrased vision is due to cataracts  --refer for eval and surgery      Actinic keratosis 07/17/2014    Family history of malignant hyperthermia 04/21/2012     Due to anesthesia      History of adenomatous polyp of  colon 08/01/2009     07/2009-->colonoscpy in 07/2014 w/ hyperplastic polyp only         History of nephrolithiasis 05/13/2009             Osteopenia 06/14/2008     DEXA 2010 w/ osteopenia         Benign paroxysmal positional vertigo 05/10/2008     Medications:     Current Outpatient Prescriptions   Medication Sig    Coenzyme Q10 (COQ-10) 10 MG capsule Take 10 mg by mouth daily    Multiple Vitamins-Minerals (PRESERVISION AREDS 2 PO) Take 1 tablet by mouth 2 times daily    calcium carbonate-vitamin D (CALCIUM 600 + D) 600-400 MG-UNIT per tablet Take 1 tablet by mouth daily    Omega-3 Fatty Acids (FISH OIL PEARLS) 300 MG CAPS 1 daily    Multiple Vitamin (MULTIVITAMIN) capsule     zoster vaccine recombinant (SHINGRIX) 50 MCG injection Inject 0.5 mLs (50 mcg total) into the muscle once   Repeat dose once in 2-6 months    pantoprazole (PROTONIX) 40 MG EC tablet Take 1 tablet (40 mg total) by mouth daily   Swallow whole. Do not crush, break, or chew.     Medication list reconciled this visit  Allergies:  Allergies   Allergen Reactions    Sevoflurane Other (See Comments)     Family history of malignant hyperthermia    Succinylcholine Other (See Comments)     Family history of malignant hyperthermia     Immunizations:     Most Recent Immunizations   Administered Date(s) Administered    Influenza Historical 2016-2017(aka FLU) 11/13/2014    Influenza, High Dose 11/25/2015    Pneumococcal 13-valent Conjugate (Prevnar 13) 07/17/2014    Pneumococcal Polysaccharide (Pneumovax) 11/25/2015    Td 03/17/1995    Tdap 06/06/2008    Zoster(Zostavax) 06/06/2008     Past Medical History:     Past Medical History:   Diagnosis Date    Nephrolithiasis     Osteopenia     Tibia/fibula fracture 04/21/2012      Past Surgical History:   Procedure Laterality Date    CONVERTED PROCEDURE Left     Filleted Finger Flap     orif  2014    left ankle    PR REMV CATARACT EXTRACAP,INSERT LENS Left 03/16/2016    Procedure: PHACO W/  IOL;  Surgeon: Corine Shelter, MD;  Location: [facility] MAIN OR;  Service: Ophthalmology    PR Gilman Right 03/30/2016    Procedure: PHACO W/ IOL;  Surgeon: Corine Shelter, MD;  Location: [facility] MAIN OR;  Service: Ophthalmology    TONSILLECTOMY      TONSILLECTOMY AND ADENOIDECTOMY         Family History:     Family History   Problem Relation Age of Onset    Cancer Mother      gallbladder cancer    High Blood Pressure Mother     Arrhythmia Mother      atrial fibrillation    Colon polyps Mother     Breast cancer Mother 51    Other Mother      Malignant Hyperthermia    High Blood Pressure Brother     Heart failure Father     Arrhythmia Father      Atrial fibrillation    Hearing loss Father     Osteoarthritis Father     Cataracts Father     Conversion Other      [id number] Hyperthermia Due To Anesthesia^^Active^    Cancer Maternal Grandmother     Diabetes Maternal Grandmother     Cancer Maternal Grandfather     Stroke Paternal Grandmother     Heart Disease Paternal Grandfather     Macular degeneration Neg Hx     Glaucoma Neg Hx     Retinal detachment Neg Hx     Amblyopia (Lazy Eye) Neg Hx     Blindness Neg Hx       Social History    She lives at home with her husband. No children. 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 She is from [place].     Social History   Substance Use Topics    Smoking status: Never Smoker    Smokeless tobacco: Never Used    Alcohol use 0.6 oz/week     1 Glasses of wine per week       PMH/PSH/FH/SH all reviewed with pt and updated as appropriate  Review of Systems     Constitutional: Feels generally well; no fevers, night sweats or unintentional weight loss.  Eyes: + wears reading glasses ;  no eye pain, UTD with eye exams   ENT: No hearing difficulties, no ear pain, no sore throat, UTD with dental  visits  CV:+ chest pain ;  No palpitations, orthopnea, or leg swelling  Respiratory: + cough at night ;  No  wheezing or dyspnea  GI: No  nausea/vomiting, abdominal pain, or change in bowel habits  GU: No dysuria, no change in urinary function.    MS: + intermittent pain in base of b/l thumbs    Skin: No rashes or new/changing skin lesions  Neuro: No headaches; no problems with speech or cognition or memory, no arm or leg weakness, no numbness or tingling  Psych: No problems with depression or anxiety   Endocrine: No polyuria or polydipsia, no heat or cold intolerance  Heme/lymph: No easy bleeding or bruising; no swollen glands  All/Immun: + mild seasonal allergies (controlled off medication)    Physical Exam:     Vitals:    09/01/16 0950   BP: 122/68   BP Location: Right arm   Patient Position: Sitting   Cuff Size: adult   Pulse: 60   Weight: 62.1 kg (137 lb)   Height: 1.51 m (4' 11.45")     Pain    09/01/16 0950   PainSc:   0 - No pain     Wt Readings from Last 3 Encounters:   09/01/16 62.1 kg (137 lb)   05/03/16 63 kg (139 lb)   03/30/16 63.8 kg (140 lb 10.5 oz)     BP Readings from Last 3 Encounters:   09/01/16 122/68   05/03/16 122/70   03/30/16 112/57       Vital Signs. As above  General: Appears healthy, comfortable, pleasant.  Skin: Normal texture, no rash or suspicious lesions.    Head: Atraumatic.    Eyes: Lids appear normal, no conjunctival injection or icterus.  PERRL;EOMI without nystagmus.    ENT: Inspection reveals normal appearing ears and nose.  Otoscopic exam shows partial left cerumen impaction right external ear canal is patent. Right tympanic membrane and visualized portion of left TM w/o erythema or bulging. Hearing is intact to normal conversational tones.  Nostrils patent.  Lips and gums are normal and teeth in adequate repair.  Mucous membranes are moist. No oral lesions; pharynx without redness or exudate.    Neck: Supple, no masses, trachea is midline.  No thyroid enlargement, tenderness, or nodules.   Breasts: Deferred  Resp: Normal respiratory effort.  CTA B/L. No wheezes, rhonchi, or rales.   CV: Regular rhythm.  Normal  S1/S2.  No murmur/rub/gallop. No carotid bruits. Abdominal aorta does not feel enlarged, no bruits.  Pedal pulses intact.  No edema.    GI: Nondistended.  BS normoactive.  Soft. Non-tender.   Lymphatic:  No cervical or supraclavicular adenopathy.    MSK:  Atraumatic.  No swelling or deformity of joints, muscles without atrophy.    Neuro:  Cranial nerves II-XII intact. No focal deficits of motor or sensory system.  DTR's symmetric.   Psych: Alert, Ox3.  Insight and judgment seem normal.  Mood/affect normal. Recent and remote memory intact.    RESULTS:     POCT - EKG:  EKG Interpretation  Completed 02/2016        Component Value Date/Time    NA 140 08/18/2016 0818    K 5.0 08/18/2016 0818    CREAT 0.92 08/18/2016 0818    GLU 91 08/18/2016 0818    HA1C 5.5 08/18/2016 0818    CA 9.9 08/18/2016 0818    VID25 50 08/18/2016 0818     Lab Results   Component Value Date  ALT 22 08/18/2016    AST 21 08/18/2016      Lab Results   Component Value Date    CHOL 198 08/18/2016    TRIG 53 08/18/2016    HDL 95 08/18/2016    LDLC 92 08/18/2016    CHHDC 2.1 08/18/2016     Lab Results   Component Value Date    TSH 2.46 08/18/2016     Lab Results   Component Value Date    WBC 5.6 08/18/2016    HGB 13.5 08/18/2016    HCT 41 08/18/2016    MCV 92 08/18/2016    PLT 306 08/18/2016     Lab Results   Component Value Date    HA1C 5.5 08/18/2016       ASSESSMENT/PLAN:     Patient comes in today for a physical exam and the following issues were identified:    1. Routine general medical examination at a health care facility     2. History of nephrolithiasis     3. History of adenomatous polyp of colon     4. Osteopenia, unspecified location     5. Family history of malignant hyperthermia     6. Actinic keratosis     7. Immunization due  zoster vaccine recombinant (SHINGRIX) 50 MCG injection   8. Gastroesophageal reflux disease, esophagitis presence not specified       Recurrent Chest Pain  I think this is secondary to reflux  Ranitidine helped  twice and PeptoBismal helped a 3rd time   She had normal ECG, troponins, and stress echo in 04/2016   Start pantoprazole 40 mg daily x 6 weeks, then every other day x 2 weeks, then stop   Recommended GERD diet Handout provided.        Osteopenia   + hx of left ankle fracture. No hx of bisphosphonate therapy.   DEXA 2010 w/ osteopenia       Lab results: 08/18/16  0818   25-OH VIT D2 <4   25-OH VIT D3 50   25-OH Vit Total 50      Recommended 1,000 units of vitamin D  Recommended 1,200 mg of calcium daily. Ideally, this is from diet.   Check DEXA scan. Provided paper script.     Family Hx of Malignant Hyperthermia   Avoid medications that can provoke malignant hyperthermia     Actinic Keratosis   No new skin lesions  Follow up with dermatology prn     Hx of Nephrolithiasis   No symptoms of recurrent stones  Kidney fxn stable  Monitor       The 10-year ASCVD risk score Mikey Bussing DC Brooke Bonito, et al., 2013) is: 10.1%    Values used to calculate the score:      Age: 70 years      Sex: Female      Is Non-Hispanic African American: No      Diabetic: No      Tobacco smoker: No      Systolic Blood Pressure: 009 mmHg      Is BP treated: No      HDL Cholesterol: 95 mg/dL      Total Cholesterol: 198 mg/dL      Lab results: 08/18/16  0818   Cholesterol 198   HDL 95   LDL Calculated 92   Triglycerides 53   Chol/HDL Ratio 2.1       No components found with this basename: NHLDC    DM Screen  Hemoglobin A1C  Date Value Ref Range Status   08/18/2016 5.5 4.0 - 6.0 % Final     Comment:                                   NON-DIABETIC         Health Maintenance   Topic Date Due    Bone Density Screening-Every 3 Years  06/15/2011    IMM-INFLUENZA (1) 10/23/2016    DEPRESSION SCREEN YEARLY  05/03/2017    BREAST CANCER SCREENING  08/31/2017    Fall Risk Screening  09/01/2017    COLON CANCER SCREENING 5 YEAR COLONOSCOPY  07/24/2019    HIV TESTING OFFERED  Completed    IMM-PREVNAR VACCINE 65 + YRS  Completed    IMM-PNEUMOVAX VACCINE 65 + YRS   Completed    IMM-ZOSTER 60 YRS +  Completed    HEPATITIS C SCREENING OFFERED  Completed       Counseling and Education    -- Health care proxy discussed. She will complete and mail back to office.   -- Lab results reviewed and discussed.  -- Diet reviewed and discussed. She eats fruits/vegetables and meat. She started weight watchers in June of 2018.   -- Body Weight reviewed and discussed. She has lost 3 lbs since February 2018.          Body mass index is 27.25 kg/(m^2).   -- Aerobic exercise discussed and encouraged a minimum of 30 minutes of moderate-intensity exercise on five days each week, as well as strength training, flexibility, and balance exercises. She walks 30 minutes daily.   -- Tobacco use discussed  -- Alcohol recommendations discussed including limiting intake to </= 7 standard drinks per week  -- Aspirin for Primary Prevention (50 to 59 w/ >/= 10% CVD risk) - NA  -- HIV test - declines   -- Hepatitis C screening for persons born 6 to 1966 - neg 11/2014   -- Routine eye and dental care discussed   -- Motor vehicle/ sports safety discussed.  -- Working smoke/ TEFL teacher discussed.      Immunizations  -- Immunizations reviewed         >> Yearly Influenza Shot Advised          >>Pneumovax 23 (>65, Immunocompromising condition, chronic heart, lung, liver disease, alcoholism, , smoker, DM, long term care residents)  - UTD         >>PCV13 (> 65, 19-64 with Immunocompromising condition (including CKD 4 or greater), functional/anatomic asplenia, CSF leak, cochlear implant) - UTD         >>Shingrix - send to pharmacy           >>Td/Tdap (every 10 years, 1 time Tdap) - due in 05/2018          >> MMR (born after 14)          >>Hep B (higher risk sexually active adults, health care workers, DM, chronic liver disease, ESRD)         >>Hep A (travel, chronic liver disease, considering foreign adoption, high risk sexual behavior)    Cancer Screening  -- Colon CA screening discussed (age 9-75 recommended,  age 94-85 possibly recommended) - due 07/2019 ; Dr Rigoberto Noel   -- OB/GYN - No vaginal bleeding.   -- Mammogram screening discussed (USPTF- every other year ages 1 to 41 ; ACS- yearly 2 to 23 / every other >54 to life  expectancy >10 years) - She had her mammogram at [place] and [facility] yesterday.   -- Cervical Cancer screening discussed (if previous normal results 21-29 every 3 years with pap, 30 - 65 pap every 3 yrs, co-testing every 5) - NA  -- Lung cancer screening discussed (LDCT: smokers/ex-smokers 32-74 yo, + 30 pk-yrs, smoking or quit <15 yrs ago- annually) - NA  -- Skin CA awareness/prevention discussed. Followed by Dr. Ardell Isaacs.    Follow Up: 1 year for physical.   Charna Archer, MD   09/01/2016 at 11:07 AM

## 2016-09-01 NOTE — Patient Instructions (Addendum)
1) Please drop off, mail, or fax [phone] a copy of your health care proxy     1) I recommend 1,000 units of vitamin D per day  2) I recommend getting 1,200 mg of calcium per day. Ideally this should be from diet     1) Start pantoprazole 40 mg daily x 6 weeks, then every other day x 2 weeks, then stop   2) I recommend a GERD diet     GERD DIET    I. GENERAL INSTRUCTIONS:    A) Avoid unusually large meals; eat three meals per day of roughly equal size.  B) Eat as little as possible within 3 hours of bedtime.  C) Avoid any foods which consistently produce symptoms (mint, spicy foods, chocolate, alcohol, coffee, carbonated beverages,tomatoes,  and citrus of juice are common offenders).

## 2016-10-06 ENCOUNTER — Encounter: Payer: Self-pay | Admitting: Primary Care

## 2016-10-13 ENCOUNTER — Ambulatory Visit: Payer: Medicare (Managed Care) | Attending: Primary Care | Admitting: Primary Care

## 2016-10-13 ENCOUNTER — Encounter: Payer: Self-pay | Admitting: Primary Care

## 2016-10-13 VITALS — BP 104/66 | HR 65 | Temp 98.4°F | Wt 136.6 lb

## 2016-10-13 DIAGNOSIS — K219 Gastro-esophageal reflux disease without esophagitis: Secondary | ICD-10-CM | POA: Insufficient documentation

## 2016-10-13 DIAGNOSIS — G4700 Insomnia, unspecified: Secondary | ICD-10-CM

## 2016-10-13 MED ORDER — RANITIDINE HCL 150 MG PO TABS *I*
150.0000 mg | ORAL_TABLET | Freq: Two times a day (BID) | ORAL | 5 refills | Status: AC | PRN
Start: 2016-10-13 — End: 2017-11-14

## 2016-10-13 NOTE — Patient Instructions (Signed)
1) Decrease pantoprazole 40 mg every other day x 14 days and then stop  2) Start taking ranitidine 150 mg twice per day as needed for dietary indiscretion

## 2016-10-13 NOTE — Progress Notes (Signed)
Progress Note    Reason For Visit:   Chief Complaint   Patient presents with    Follow-up     6 week       Subjective:      [name] is 71 y.o. year old female with  With a history of osteopenia, nephrolithiasis, and actinic keratoses who presents to clinic for follow-up of recurrent chest pain.    Recurrent chest pain-discussed at her physical on 09/01/16.  The pain involves the "bra line".  She described it as a tightness across her anterior chest.  The episodes can last several hours.  I thought it might be secondary to heartburn.  I recommended starting pantoprazole 40 mg daily 6 weeks, then every other day 2 weeks, and then stopping.  I recommended a GERD diet.  I provided her with a handout.  She has had a normal stress test, troponins, and EKG in March 2018.    She has finished taking 6 weeks of pantoprazole today. She has not had any recurrent chest since being on the pantoprazole. She has a mild cough that she attributes to post nasal drip. No heartburn. No dysthagia  or blood in stool. No diarrhea, nausea, or vomiting. No SOB.  She has not made any significant dietary changes.     Insomnia-she is having some trouble falling back asleep when she sleeps.  She said this started after taking the pantoprazole.  She says she wakes up to use the bathroom and then can't go back to sleep for couple hours.  She gets out of bed and reads when this occurs.    Problem List was reviewed in e-record today.   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 Do not crush, break, or chew. 30 tablet 1    Multiple Vitamins-Minerals (PRESERVISION AREDS 2 PO) Take 1 tablet by mouth 2 times daily      calcium carbonate-vitamin D (CALCIUM 600 + D) 600-400 MG-UNIT per tablet Take 1 tablet by mouth daily 60 tablet 0    Omega-3 Fatty Acids (FISH OIL PEARLS) 300 MG CAPS 1 daily 60 capsule     Multiple Vitamin (MULTIVITAMIN) capsule   0     No current facility-administered medications on file prior to visit.        Medications were reviewed and reconciled today. Appropriate changes were made.    Allergies:     Allergies   Allergen Reactions    Sevoflurane Other (See Comments)     Family history  of malignant hyperthermia  Family history of malignant hyperthermia    Succinylcholine Other (See Comments)     Family history of malignant hyperthermia  Family history of malignant hyperthermia       Allergies were reviewed with patient today.  Review of Systems:   ROS . See HPI    Vitals:     Vitals:    10/13/16 0927   BP: 104/66   Pulse: 65   Temp: 36.9 C (98.4 F)   SpO2: 97%   Weight: 62 kg (136 lb 9.6 oz)     Wt Readings from Last 3 Encounters:   10/13/16 62 kg (136 lb 9.6 oz)   09/01/16 62.1 kg (137 lb)   05/03/16 63 kg (139 lb)     BP Readings from Last 3 Encounters:   10/13/16 104/66   09/01/16 122/68   05/03/16 122/70       Physical Exam    General: Appears stated age.  In no apparent distress.  See VS.    HEENT: Eyes- PERRL. No scleral icterus. Throat- MMM. No exudates.  Neck:  No cervical or supraclavicular LAD.  Pulmonary: Good effort. CTA B/L. No wheezes, rhonchi, or rales.  Cardiovascular:  RRR. No murmurs, rubs, or gallops. No LE edema.  Abdominal: Soft. Non-distended. Non-tender. Bowel sounds present.      Orders:   No orders of the defined types were placed in this encounter.      Assessment and Plan:     71 y.o. y/o female With a history of osteopenia, nephrolithiasis, and actinic keratoses who presents to clinic for follow-up of recurrent chest pain.    Recurrent chest pain   Likely secondary to GERD  No symptoms since being on pantoprazole  She is status post a 6 week course of pantoprazole  Decrease  pantoprazole to 40 mg every other day 2 weeks and then stop  Start ranitidine 150 mg twice a day prn  dietary indiscretion  If symptoms recur, I would try starting ranitidine twice a day     Insomnia  She has trouble going back to sleep when she wakes up  She thinks this has gotten worse on pantoprazole.  She'll be off pantoprazole in 2 weeks.  Hopefully, this will make this  better.  She gets up to use the bathroom.  Recommend not drinking water after 6 PM.  Recommended using the bathroom prior to going to sleep.  We will continue to monitor    Follow up as needed.     Brayton Mars, MD 10/13/2016 9:44 AM  [facility]

## 2016-10-20 ENCOUNTER — Other Ambulatory Visit: Payer: Self-pay | Admitting: Gastroenterology

## 2016-10-20 LAB — HM DEXA SCAN

## 2016-10-26 ENCOUNTER — Encounter: Payer: Self-pay | Admitting: Primary Care

## 2016-10-26 ENCOUNTER — Other Ambulatory Visit: Payer: Self-pay | Admitting: Primary Care

## 2016-10-28 ENCOUNTER — Encounter: Payer: Self-pay | Admitting: Primary Care

## 2016-11-08 ENCOUNTER — Encounter: Payer: Self-pay | Admitting: Ophthalmology

## 2016-11-08 ENCOUNTER — Ambulatory Visit: Payer: Medicare (Managed Care) | Attending: Ophthalmology | Admitting: Ophthalmology

## 2016-11-08 DIAGNOSIS — H26493 Other secondary cataract, bilateral: Secondary | ICD-10-CM

## 2016-11-08 DIAGNOSIS — H527 Unspecified disorder of refraction: Secondary | ICD-10-CM

## 2016-11-08 DIAGNOSIS — H35361 Drusen (degenerative) of macula, right eye: Secondary | ICD-10-CM

## 2016-11-08 DIAGNOSIS — Z961 Presence of intraocular lens: Secondary | ICD-10-CM

## 2016-11-08 NOTE — Progress Notes (Signed)
Outpatient Visit      Patient name: [name]  DOB: [birth year]       Age: 71 y.o.  MR#: [id number]    Encounter Date: 11/08/2016    Subjective:      Chief Complaint   Patient presents with    Follow-up     PCIOL OU final check     HPI     Follow-up    Additional comments: PCIOL OU final check           Comments    Dossie Ocanas Kosch is a 71 y.o. female  Cc: Pt states VA's are good, Pt states no floaters no flashes no pain, Pt   sates she did not get Mrx and is fine with that may want copy of MRx just   in case  Hx: PCIOL OU  Eye Meds: none             Last edited by Ladora Daniel, MD on 11/08/2016 11:02 AM. (History)        has a current medication list which includes the following prescription(s): guaifenesin, ranitidine, coenzyme q10, multiple vitamins-minerals, calcium carbonate-vitamin d, fish oil pearls, and multivitamin.     is allergic to sevoflurane and succinylcholine.      Past Medical History:   Diagnosis Date    Nephrolithiasis     Osteopenia     Tibia/fibula fracture 04/21/2012      Past Surgical History:   Procedure Laterality Date    CONVERTED PROCEDURE Left     Filleted Finger Flap     orif  2014    left ankle    PR REMV CATARACT EXTRACAP,INSERT LENS Left 03/16/2016    Procedure: PHACO W/ IOL;  Surgeon: Ladora Daniel, MD;  Location: [facility] MAIN OR;  Service: Ophthalmology    PR REMV CATARACT EXTRACAP,INSERT LENS Right 03/30/2016    Procedure: PHACO W/ IOL;  Surgeon: Ladora Daniel, MD;  Location: [facility] MAIN OR;  Service: Ophthalmology    TONSILLECTOMY      TONSILLECTOMY AND ADENOIDECTOMY          Specialty Problems        Ophthalmology Problems    Extramacular Drusen, right eye        PVD (posterior vitreous detachment), bilateral        Refractive error        Pseudophakia of left eye (SN60WF +15.5D 03/16/2016)        Pseudophakia of right eye (SN60WF +15.5D 03/30/16)               ROS     Positive for: Eyes    Negative for: Constitutional, Gastrointestinal, Neurological, Skin,      Genitourinary, Musculoskeletal, HENT, Endocrine, Cardiovascular,   Respiratory, Psychiatric, Allergic/Imm, Heme/Lymph    Last edited by Elberta Leatherwood on 11/08/2016 10:45 AM. (History)         Objective:     Base Eye Exam     Visual Acuity (Snellen - Linear)      Right Left   Dist sc 20/30 20/30         Tonometry (Tonopen, 10:55 AM)      Right Left   Pressure 15 15         Pupils      APD   Right None   Left None         Extraocular Movement      Right Left   Result Full Full  Neuro/Psych     Oriented x3:  Yes    Mood/Affect:  Normal            Slit Lamp and Fundus Exam     External Exam      Right Left    External Normal ocular adnexae, lacrimal gland & drainage, orbits Normal ocular adnexae, lacrimal gland & drainage, orbits      Slit Lamp Exam      Right Left    Lids/Lashes Normal structure & position Normal structure & position    Conjunctiva/Sclera Normal bulbar/palpebral, conjunctiva, sclera Normal bulbar/palpebral, conjunctiva, sclera    Cornea TCCI; mild spk TCCI; mild spk    Anterior Chamber Clear & deep Clear & deep    Iris Normal shape, size, morphology Normal shape, size, morphology    Lens PCIOL, 1-2+ PCO PCIOL, 1+ PCO    Vitreous PVD PVD      Fundus Exam      Right Left    Disc Normal size, appearance, nerve fiber layer Normal size, appearance, nerve fiber layer    C/D Ratio 0.3 0.3    Macula Normal Normal    Vessels Normal Normal    Periphery Drusen, Extramacular drusen, PVD PVD, No drusen            Refraction     Manifest Refraction      Sphere Cylinder Axis Add   Right -0.75 -0.25 110 +2.50   Left -0.50 -0.25 075 +2.50         Final Rx      Sphere Cylinder Axis Add   Right -0.75 -0.25 110 +2.50   Left -0.50 -0.25 075 +2.50                 Final Rx      Sphere Cylinder Axis Add   Right -0.75 -0.25 110 +2.50   Left -0.50 -0.25 075 +2.50                             Assessment/Plan:     1. Pseudophakia of left eye (SN60WF +15.5D 03/16/2016)     2. Pseudophakia of right eye (SN60WF +15.5D  03/30/16)     3. PCO (posterior capsular opacification), bilateral     4. Extramacular Drusen, right eye     5. Refractive error          PLAN:  1/2. PCIOL stable.     3. NVS, monitor    4. Stable.     5. Provided MRx today.        Patient was seen and examined.  Findings, assessment, and plan were discussed in detail with the patient, who verbalized understanding of and agreement with plan. All questions were answered. The patient was instructed to call or come in if there are any new symptoms or existing symptoms persist or worsen. To call if questions or concerns: [phone], number provided. Follow up was arranged.

## 2016-11-12 ENCOUNTER — Ambulatory Visit: Payer: Medicare (Managed Care) | Admitting: Ophthalmology

## 2017-01-24 ENCOUNTER — Encounter: Payer: Self-pay | Admitting: Primary Care

## 2017-01-24 DIAGNOSIS — R059 Cough, unspecified: Secondary | ICD-10-CM

## 2017-01-24 MED ORDER — HYDROCOD POLST-CPM POLST ER 10-8 MG/5ML PO SUER *I*
5.0000 mL | Freq: Every evening | ORAL | 0 refills | Status: DC | PRN
Start: 2017-01-24 — End: 2017-03-04

## 2017-01-25 ENCOUNTER — Ambulatory Visit: Payer: Medicare (Managed Care) | Attending: Primary Care | Admitting: Primary Care

## 2017-01-25 ENCOUNTER — Encounter: Payer: Self-pay | Admitting: Primary Care

## 2017-01-25 VITALS — BP 126/80 | HR 76 | Temp 98.9°F | Ht 59.45 in | Wt 139.0 lb

## 2017-01-25 DIAGNOSIS — J019 Acute sinusitis, unspecified: Secondary | ICD-10-CM

## 2017-01-25 MED ORDER — FLUTICASONE PROPIONATE 50 MCG/ACT NA SUSP *I*
2.0000 | Freq: Every day | NASAL | 0 refills | Status: DC
Start: 2017-01-25 — End: 2017-03-09

## 2017-01-25 NOTE — Patient Instructions (Signed)
1) Start fluticasone nasal spray 2 sprays each nostril nightly x 7 to 14 days   2) continue Tussionex as needed    1) Start senna 1 to 2 tablets nightly. STAY HYDRATED     If no improvement in 48 hrs or worsening symptoms, call office.

## 2017-01-25 NOTE — Progress Notes (Signed)
Progress Note    Reason For Visit:   Chief Complaint   Patient presents with    Cough     chest congestion, fatigue x 1 week       Subjective:      [name] is 71 y.o. year old female with a history of nephrolithiasis and GERD who presents to clinic for cough.    Cough - Started 1 week ago. Got worse on Sunday. She is having coughing attacks. Triggered by talking and laying down. Cough not productive.  She had two attacks last night despite taking Tussionex. Last week, she took aleve a few times for body aches. This helped with the ache. + rhinorrhea, post nasal drip, shortness of breath w/ prolonged coughing, and constipation  No fevers, chills, sinus pressure, ear pain, sore throat, wheezing, nausea, diarrhea, or rash. No LE swelling or posterior calf tenderness. No sick contacts.+ [place] in [place]. [address].     Flu Shot - UTD    PMHx:  No hx of asthma     Social Hx:  Never smoker     Problem List was reviewed in e-record today.   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 03/16/2016) Z96.1    Pseudophakia of right eye (SN60WF +15.5D 03/30/16) Z96.1    Routine general medical examination at a health care facility Z00.00    GERD (gastroesophageal reflux disease) K21.9    PCO (posterior capsular opacification), bilateral H26.493         Medications:     Current Outpatient Prescriptions on File Prior to Visit   Medication Sig Dispense Refill    HYDROcodone/chlorpheniramine polistirex (TUSSIONEX) 10-8 mg/565mL extended release suspension Take 5 mLs by mouth nightly as needed for Cough    Max daily dose: 5 mLs 35 mL 0    ranitidine (ZANTAC) 150 MG tablet Take 1 tablet (150 mg total) by mouth 2 times daily as needed for Heartburn 60 tablet 5    Coenzyme Q10 (COQ-10) 10 MG capsule Take 10 mg by mouth daily      Multiple Vitamins-Minerals (PRESERVISION AREDS 2 PO) Take 1 tablet by mouth 2 times daily      calcium carbonate-vitamin D (CALCIUM 600 + D) 600-400 MG-UNIT per tablet Take 1 tablet by mouth daily 60 tablet 0    Omega-3 Fatty Acids (FISH OIL PEARLS) 300 MG CAPS 1 daily 60 capsule     Multiple Vitamin (MULTIVITAMIN) capsule   0     No current facility-administered medications on file prior to visit.        Medications were reviewed and reconciled today. Appropriate changes were made.    Allergies:     Allergies   Allergen Reactions    Sevoflurane Other (See Comments)     Family history of malignant hyperthermia  Family history of malignant hyperthermia    Succinylcholine Other (See Comments)     Family history of malignant hyperthermia  Family history of malignant hyperthermia       Allergies were reviewed with patient today.  Review of Systems:   ROS . See HPI    Vitals:     Vitals:    01/25/17 1451   BP: 126/80   BP Location: Right arm   Patient Position: Sitting   Cuff Size: adult   Pulse: 76   Temp: 37.2 C (98.9 F)   TempSrc: Temporal   SpO2: 97%   Weight: 63 kg (139 lb)   Height: 1.51 m (4' 11.45")     Wt Readings from Last 3 Encounters:   01/25/17 63 kg (139 lb)   10/13/16 62 kg (136 lb 9.6 oz)   09/01/16 62.1 kg (137 lb)     BP Readings from Last 3 Encounters:   01/25/17 126/80   10/13/16 104/66   09/01/16 122/68       Physical Exam    General: Appears stated age. In no apparent distress. Talking in full sentences.   Head: NCAT  Eyes: No conjunctivitis or drainage. No scleral icterus.   Ears: Right Ear: No erythema of ear canal. Light reflex intact. No bulging or erythema. Left Ear:  No erythema of ear canal. Light reflex intact. No bulging or erythema.  Sinuses: No maxillary or  frontal sinus tenderness.  Nose: +  Erythema & edema of nasal turbinates. N purulent drainage.   Throat: MMM. No erythema or oral exudates. Uvula midline.   Lymph: No pre-auricular, post auricular, cervical, posterior cervical, submandibular, submental, or supraclavicular LAD.  Lungs: Good effort. CTA B/L  CVS: RRR.       Orders:   No orders of the defined types were placed in this encounter.      Assessment and Plan:     71 y.o. y/o female with a history of nephrolithiasis and GERD who presents to clinic for cough.    Cough  Likely 2/2 viral sinusitis w/ post nasal drip  Lungs clear so pneumonia and reactive airway disease less likely   Duration of symptoms make bacterial sinusitis less likely but needs to be considered if no improvement  Has travel hx so pulmonary embolus needs to be considered. I think this is less likely given the symptom of nasal congestion, normal heart rate, and normal oxygen saturation.  1) Start fluticasone nasal spray 2 sprays each nostril nightly x 7 to 14 days   2) continue Tussionex as needed    Constipation  2/2 Tussionex  1) Start senna 1 to 2 tablets nightly. STAY HYDRATED     If no improvement in 48 hrs or worsening symptoms, she Miranda call the office.     Brayton MarsMark Jadian Karman, MD 01/25/2017 3:18 PM  [facility]

## 2017-02-09 DIAGNOSIS — H26499 Other secondary cataract, unspecified eye: Secondary | ICD-10-CM

## 2017-02-09 DIAGNOSIS — Z961 Presence of intraocular lens: Secondary | ICD-10-CM

## 2017-03-03 ENCOUNTER — Encounter: Payer: Self-pay | Admitting: Primary Care

## 2017-03-03 DIAGNOSIS — R059 Cough, unspecified: Secondary | ICD-10-CM

## 2017-03-04 MED ORDER — HYDROCOD POLST-CPM POLST ER 10-8 MG/5ML PO SUER *I*
5.0000 mL | Freq: Every evening | ORAL | 0 refills | Status: AC | PRN
Start: 2017-03-04 — End: ?

## 2017-03-09 ENCOUNTER — Other Ambulatory Visit: Payer: Self-pay | Admitting: Primary Care

## 2017-03-09 ENCOUNTER — Telehealth: Payer: Self-pay | Admitting: Primary Care

## 2017-03-09 DIAGNOSIS — J329 Chronic sinusitis, unspecified: Secondary | ICD-10-CM

## 2017-03-09 DIAGNOSIS — B9689 Other specified bacterial agents as the cause of diseases classified elsewhere: Secondary | ICD-10-CM

## 2017-03-09 DIAGNOSIS — J019 Acute sinusitis, unspecified: Secondary | ICD-10-CM

## 2017-03-09 MED ORDER — AMOXICILLIN-POT CLAVULANATE 875-125 MG PO TABS *I*
1.0000 | ORAL_TABLET | Freq: Two times a day (BID) | ORAL | 0 refills | Status: AC
Start: 2017-03-09 — End: 2017-03-16

## 2017-03-09 NOTE — Telephone Encounter (Signed)
Last Appointment: 01/25/2017  Next Appointment: none

## 2017-03-09 NOTE — Telephone Encounter (Signed)
Patient called stated she's still has her cough was informed to call if office to schedule an appointment if not better after taking the medication that was prescribed by MH.  No PA opening available today or tomorrow and only same day Friday.please call patient at [phone]            HB/GD

## 2017-03-09 NOTE — Telephone Encounter (Signed)
I spoke to [name]. She continues to have a cough.She feels like there is "gunk in the back of her throat" from post nasal drip. Not bringing anything up. She has mild congestion. She is taking mucinex and flonase. No fevers. No facial pressure. She has persistent congestion and post nasal drip for over 1 month not responding to conservative therapy. I will treat for bacterial sinusitis w/ augmentin 1 tab BID x 7 days.     MH

## 2017-03-22 ENCOUNTER — Encounter: Payer: Self-pay | Admitting: Primary Care

## 2017-03-22 ENCOUNTER — Ambulatory Visit: Payer: Medicare (Managed Care) | Attending: Primary Care | Admitting: Primary Care

## 2017-03-22 ENCOUNTER — Ambulatory Visit
Admission: RE | Admit: 2017-03-22 | Discharge: 2017-03-22 | Disposition: A | Discharge status: Home / Self Care | Payer: Medicare (Managed Care) | Source: Ambulatory Visit

## 2017-03-22 VITALS — BP 126/84 | HR 63 | Temp 97.8°F | Ht 59.45 in | Wt 139.0 lb

## 2017-03-22 DIAGNOSIS — R059 Cough, unspecified: Secondary | ICD-10-CM

## 2017-03-22 DIAGNOSIS — R05 Cough: Secondary | ICD-10-CM

## 2017-03-22 DIAGNOSIS — R0982 Postnasal drip: Secondary | ICD-10-CM

## 2017-03-22 MED ORDER — CETIRIZINE HCL 10 MG PO TABS *I*
10.0000 mg | ORAL_TABLET | Freq: Every day | ORAL | 1 refills | Status: DC
Start: 2017-03-22 — End: 2017-05-19

## 2017-03-22 NOTE — Patient Instructions (Signed)
1) Please get chest x-ray  2) Start cetirizine 10 mg daily.   3) Continue fluticasone nasal spray 2 sprays each nostril prior to bed   4) Continue Mucinex 12 hr 600 mg twice per day     If no improvement by Thursday, let me know

## 2017-03-22 NOTE — Progress Notes (Signed)
Progress Note    Reason For Visit:   Chief Complaint   Patient presents with    Cough     Unproductive       Subjective:      [name] is 72 y.o. year old female with a hx of osteopenia, nephrolithiasis, and GERD who presents to clinic for cough.    Cough - Last discussed on 01/25/17. Present for 1week. Dx w/ viral sinusitis w/ post nasal drip. Recommended Flonase x 7 to 14 days and Tussionex prn. Symptoms persisted. She called office on 03/09/17. She was having post nasal drip, mild congestion, and cough. Treated with Augmentin BID x 7 days for bacterial sinusitis. She completed the course of Augmentin. She feels like it decreased "the amount of gunk" in the back of her throat. She continues to have a cough that is worst at night.It wakes her up. She feels stuff in the back of her throat and has nasal congestion. The cough is unproductive.  If she takes Tussionex, she is able to sleep. She is also using flonase and mucinex prior to sleep. This helps some. + slight increase in sneezing. + dry house 2/2 wood stove. + occasional diarrhea. No itchy eyes. No shortness of breath or wheezing. No heartburn. No fevers or chills. No ear pain or sore throat. No nausea or vomiting. UTD w/ flu shot.     PMHx:  No hx of allergies or asthma    Social Hx:  Never smoker     Problem List was reviewed in e-record today.   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 03/16/2016) Z96.1    Pseudophakia of right eye (SN60WF +15.5D 03/30/16) Z96.1    Routine general medical examination at a health care facility Z00.00    GERD  (gastroesophageal reflux disease) K21.9    PCO (posterior capsular opacification), bilateral H26.493         Medications:     Current Outpatient Prescriptions on File Prior to Visit   Medication Sig Dispense Refill    fluticasone (FLONASE) 50 MCG/ACT nasal spray SPRAY 2 SPRAYS INTO EACH NOSTRIL EVERY DAY 16 g 0    Coenzyme Q10 (COQ-10) 10 MG capsule Take 10 mg by mouth daily      Multiple Vitamins-Minerals (PRESERVISION AREDS 2 PO) Take 1 tablet by mouth 2 times daily      calcium carbonate-vitamin D (CALCIUM 600 + D) 600-400 MG-UNIT per tablet Take 1 tablet by mouth daily 60 tablet 0    Omega-3 Fatty Acids (FISH OIL PEARLS) 300 MG CAPS 1 daily 60 capsule     Multiple Vitamin (MULTIVITAMIN) capsule   0    HYDROcodone-chlorpheniramine polistirex (TUSSIONEX) 10-8 mg/16mL extended release suspension Take 5 mLs by mouth nightly as needed for Cough   Max daily dose: 5 mLs 35 mL 0    ranitidine (ZANTAC) 150 MG tablet Take 1 tablet (150 mg total) by mouth 2 times daily as needed for Heartburn 60 tablet 5     No current facility-administered medications on file prior to  visit.        Medications were reviewed and reconciled today. Appropriate changes were made.    Allergies:     Allergies   Allergen Reactions    Sevoflurane Other (See Comments)     Family history of malignant hyperthermia  Family history of malignant hyperthermia    Succinylcholine Other (See Comments)     Family history of malignant hyperthermia  Family history of malignant hyperthermia       Allergies were reviewed with patient today.  Review of Systems:   ROS . See HPI    Vitals:     Vitals:    03/22/17 0959   BP: 126/84   BP Location: Left arm   Patient Position: Sitting   Cuff Size: adult   Pulse: 63   Temp: 36.6 C (97.8 F)   TempSrc: Temporal   SpO2: 100%   Weight: 63 kg (139 lb)   Height: 1.51 m (4' 11.45")     Wt Readings from Last 3 Encounters:   03/22/17 63 kg (139 lb)   01/25/17 63 kg (139 lb)   10/13/16 62 kg (136 lb 9.6 oz)     BP  Readings from Last 3 Encounters:   03/22/17 126/84   01/25/17 126/80   10/13/16 104/66       Physical Exam    General: Appears stated age. In no apparent distress. Talking in full sentences.   Head: NCAT  Eyes: No conjunctivitis or drainage. No scleral icterus.   Ears: Right Ear: No erythema of ear canal. Light reflex intact. No bulging or erythema. Left Ear:  No erythema of ear canal. Light reflex intact. No bulging or erythema.  Sinuses: No maxillary or frontal sinus tenderness.  Nose: + pale edematous nasal turbinates.   Throat: MMM. No erythema or oral exudates. Uvula midline.   Lymph: No pre-auricular, post auricular, cervical, posterior cervical, submandibular, submental, or supraclavicular LAD.  Lungs: Good effort. CTA B/L. No forced expiratory wheezes.   CVS: RRR.       Orders:     Orders Placed This Encounter   Procedures    *Chest standard frontal and lateral views       Assessment and Plan:     72 y.o. y/o female ith a hx of osteopenia, nephrolithiasis, and GERD who presents to clinic for cough.    Persistent Cough  Unclear etiology but allergic rhinitis causing post nasal drip is highest on my differential   She is s/p 7 days of Augmentin with some improvement in post nasal drip and cough but it persists  Given duration of symptoms, we need to rule out pneumonia w/ a chest x-ray   1) Check chest x-ray  2) Start cetirizine 10 mg daily.   3) Continue fluticasone nasal spray 2 sprays each nostril prior to bed   4) Continue Mucinex 12 hr 600 mg twice per day     If no improvement by Thursday, she will let me know     Follow up as previously scheduled.     Brayton MarsMark Ashton Belote, MD 03/26/2017 12:12 PM  [facility]

## 2017-05-19 ENCOUNTER — Other Ambulatory Visit: Payer: Self-pay | Admitting: Primary Care

## 2017-05-19 DIAGNOSIS — R0982 Postnasal drip: Secondary | ICD-10-CM

## 2017-05-19 NOTE — Telephone Encounter (Signed)
Last Appointment: 03/22/2017  Next Appointment: Future Appointments  Date Time Provider Department Center   11/14/2017 10:30 AM Ladora DanielKlein, Christian, MD [facility] None

## 2017-09-26 ENCOUNTER — Other Ambulatory Visit: Payer: Self-pay | Admitting: Gastroenterology

## 2017-09-26 LAB — HM MAMMOGRAPHY

## 2017-09-30 ENCOUNTER — Encounter: Payer: Self-pay | Admitting: Primary Care

## 2017-11-14 ENCOUNTER — Ambulatory Visit: Payer: Medicare (Managed Care) | Attending: Ophthalmology | Admitting: Ophthalmology

## 2017-11-14 VITALS — BP 126/76 | HR 80

## 2017-11-14 DIAGNOSIS — Z961 Presence of intraocular lens: Secondary | ICD-10-CM

## 2017-11-14 DIAGNOSIS — H35361 Drusen (degenerative) of macula, right eye: Secondary | ICD-10-CM

## 2017-11-14 DIAGNOSIS — H43813 Vitreous degeneration, bilateral: Secondary | ICD-10-CM

## 2017-11-14 DIAGNOSIS — H527 Unspecified disorder of refraction: Secondary | ICD-10-CM

## 2017-11-14 DIAGNOSIS — H26493 Other secondary cataract, bilateral: Secondary | ICD-10-CM

## 2017-11-14 NOTE — Progress Notes (Signed)
Outpatient Visit      Patient name: [name]  DOB: 02/08/1946       Age: 72 y.o.  MR#: [id number]    Encounter Date: 11/14/2017    Subjective:      Chief Complaint   Patient presents with    Blurred Vision     HPI      [name] is a 72 y.o. Female with hx of PCIOL OU who comes in   for an annual eye exam.    Patient states Va seems slightly blurry. She notice difficulty adjusting   from bright to dark places. She denies any headaches, tearing, dryness,   itchiness, burning, eye pain, floaters or flashes of light. Not using any   eye drops.             Last edited by Ladora DanielKlein, Aayush Gelpi, MD on 11/14/2017 11:06 AM. (History)        has a current medication list which includes the following prescription(s): coenzyme q10, cetirizine, fluticasone, hydrocodone-chlorpheniramine polistirex, ranitidine, multiple vitamins-minerals, calcium carbonate-vitamin d, fish oil pearls, multivitamin, and coenzyme q10.     is allergic to sevoflurane and succinylcholine.      Past Medical History:   Diagnosis Date    Nephrolithiasis     Osteopenia     Tibia/fibula fracture 04/21/2012      Past Surgical History:   Procedure Laterality Date    CONVERTED PROCEDURE Left     Filleted Finger Flap     orif  2014    left ankle    PR REMV CATARACT EXTRACAP,INSERT LENS Left 03/16/2016    Procedure: PHACO W/ IOL;  Surgeon: Ladora DanielKlein, Gesenia Bantz, MD;  [facility];  Service: Ophthalmology    PR REMV CATARACT EXTRACAP,INSERT LENS Right 03/30/2016    Procedure: PHACO W/ IOL;  Surgeon: Ladora DanielKlein, Zan Triska, MD;  [facility];  Service: Ophthalmology    TONSILLECTOMY      TONSILLECTOMY AND ADENOIDECTOMY          Specialty Problems        Ophthalmology Problems    Extramacular Drusen, right eye        PVD (posterior vitreous detachment), bilateral        Refractive error        Pseudophakia of left eye (SN60WF +15.5D 03/16/2016)        Pseudophakia of right eye (SN60WF +15.5D 03/30/16)        PCO (posterior capsular  opacification), bilateral               ROS     Positive for: Eyes    Last edited by Chase CallerVega, Luizaida, COA on 11/14/2017 10:20 AM. (History)         Objective:     Base Eye Exam     Visual Acuity (Snellen - Linear)       Right Left    Dist sc 20/25 -2 20/25 -2    Dist cc 20/25 20/20 -1    Near cc J1 J1    Correction:  Glasses          Tonometry (Tonopen, 10:38 AM)       Right Left    Pressure 19 17          Tonometry #2 (Applanation, 11:14 AM)       Right Left    Pressure 15 15          Pupils       Dark Light React  APD    Right 5 3 Slow None    Left 5 3 Slow None          Visual Fields (Counting fingers)       Left Right     Full Full          Extraocular Movement       Right Left     Full, Ortho Full, Ortho          Neuro/Psych     Oriented x3:  Yes    Mood/Affect:  Normal          Dilation     Both eyes:  2.5% Phenylephrine, 1.0% Tropicamide @ 10:38 AM            Slit Lamp and Fundus Exam     External Exam       Right Left    External Normal ocular adnexae, lacrimal gland & drainage, orbits Normal ocular adnexae, lacrimal gland & drainage, orbits          Slit Lamp Exam       Right Left    Lids/Lashes Normal structure & position Normal structure & position    Conjunctiva/Sclera Normal bulbar/palpebral, conjunctiva, sclera Normal bulbar/palpebral, conjunctiva, sclera    Cornea TCCI; mild spk TCCI; mild spk    Anterior Chamber Clear & deep Clear & deep    Iris Normal shape, size, morphology Normal shape, size, morphology    Lens PCIOL, 2+ PCO diffusely PCIOL, 1-2+ PCO    Vitreous PVD PVD          Fundus Exam       Right Left    Disc Normal size, appearance, nerve fiber layer Normal size, appearance, nerve fiber layer    C/D Ratio 0.25 0.25    Macula Normal Normal    Vessels Normal Normal    Periphery Drusen, Extramacular drusen, PVD PVD, No drusen            Refraction     Wearing Rx       Sphere Cylinder Axis Add    Right -0.75 -0.25 110 +2.50    Left -0.50 -0.25 075 +2.50    Type:  PAL          Manifest Refraction        Sphere Cylinder Axis Dist VA Add [place]    Right -0.50 -0.75 095 20/20 +2.50 J1+    Left -0.50 -0.25 090 20/20 +2.50 ou    Cloudier OS per pt                                  Assessment/Plan:     1. Pseudophakia of left eye (SN60WF +15.5D 03/16/2016)     2. Pseudophakia of right eye (SN60WF +15.5D 03/30/16)     3. PCO (posterior capsular opacification), bilateral     4. Extramacular Drusen, right eye     5. Refractive error     6. PVD (posterior vitreous detachment), bilateral          PLAN:  1-3. PCIOL OU  - PCO OU but NVS. Return for YAG if symptomatic.    4. Stable.     5. Provided DMV form today.    6. Discussed at length signs/sx of retinal detachment.  Instructed to contact this office urgently for new floaters/flashes and/or shadow in vision.          - Pt  moving to [facility] so no f/u scheduled at the moment              Patient was seen and examined.  Findings, assessment, and plan were discussed in detail with the patient, who verbalized understanding of and agreement with plan. All questions were answered. The patient was instructed to call or come in if there are any new symptoms or existing symptoms persist or worsen. To call if questions or concerns: [phone], number provided. Follow up was arranged.

## 2017-11-16 ENCOUNTER — Encounter: Payer: Self-pay | Admitting: Primary Care

## 2018-01-16 ENCOUNTER — Encounter: Payer: Self-pay | Admitting: Ophthalmology

## 2018-01-16 ENCOUNTER — Ambulatory Visit: Payer: Medicare (Managed Care) | Attending: Ophthalmology | Admitting: Ophthalmology

## 2018-01-16 DIAGNOSIS — H527 Unspecified disorder of refraction: Secondary | ICD-10-CM

## 2018-01-16 DIAGNOSIS — H35361 Drusen (degenerative) of macula, right eye: Secondary | ICD-10-CM

## 2018-01-16 DIAGNOSIS — Z961 Presence of intraocular lens: Secondary | ICD-10-CM

## 2018-01-16 DIAGNOSIS — H26493 Other secondary cataract, bilateral: Secondary | ICD-10-CM

## 2018-01-16 DIAGNOSIS — H43813 Vitreous degeneration, bilateral: Secondary | ICD-10-CM

## 2018-01-16 HISTORY — PX: OTHER SURGICAL HISTORY: SHX169

## 2018-01-16 NOTE — Patient Instructions (Signed)
Please call [phone] if you have any questions or concerns after your treatment today.  Please call with any changes in your vision, loss of vision, increasing floaters, flashes of light, eye pain or discharge.    Use prednisolone drops,  one drop to both eyes 4 times a day for 7 days.  Then stop.  Shake bottle before use.          Prednisolone (Ophthalmic) (pred NISS [place])    Brand Names: [place] Omnipred; Pred Forte; Pred Mild   Brand Names: [place] Minims Prednisolone Sodium Phosphate; PMS-Prednisolone Sodium Phosphate Forte; Pred Forte; Pred Mild; Ratio-Prednisolone ; Sandoz Prednisolone   What is this drug used for?   · It is used to treat eye swelling.  What do I need to tell my doctor BEFORE I take this drug?   · If you have an allergy to prednisolone or any other part of this drug.  · If you are allergic to any drugs like this one, any other drugs, foods, or other substances. Tell your doctor about the allergy and what signs you had, like rash; hives; itching; shortness of breath; wheezing; cough; swelling of face, lips, tongue, or throat; or any other signs.  · If you have any of these health problems: A fungal, TB (tuberculosis), or viral infection of the eye.  · If you have just had something taken out of your eye.  · This is not a list of all drugs or health problems that interact with this drug.  · Tell your doctor and pharmacist about all of your drugs (prescription or OTC, natural products, vitamins) and health problems. You must check to make sure that it is safe for you to take this drug with all of your drugs and health problems. Do not start, stop, or change the dose of any drug without checking with your doctor.  What are some things I need to know or do while I take this drug?   · Tell dentists, surgeons, and other doctors that you use this drug.  · Use care when driving or doing other tasks that call for clear eyesight.  · Long-term use may raise the chance of cataracts or glaucoma. Talk with  the doctor.  · Have your eye pressure checked if you are on this drug for a long time. Talk with your doctor.  · Do not use longer than you have been told by the doctor.  · Tell your doctor if you are pregnant or plan on getting pregnant. You will need to talk about the benefits and risks of using this drug while you are pregnant.  · Tell your doctor if you are breast-feeding. You will need to talk about any risks to your baby.  What are some side effects that I need to call my doctor about right away?   · WARNING/CAUTION: Even though it may be rare, some people may have very bad and sometimes deadly side effects when taking a drug. Tell your doctor or get medical help right away if you have any of the following signs or symptoms that may be related to a very bad side effect:  · Signs of an allergic reaction, like rash; hives; itching; red, swollen, blistered, or peeling skin with or without fever; wheezing; tightness in the chest or throat; trouble breathing or talking; unusual hoarseness; or swelling of the mouth, face, lips, tongue, or throat.  · Change in eyesight, eye pain, or very bad eye irritation.  What are some other   side effects of this drug?   · All drugs may cause side effects. However, many people have no side effects or only have minor side effects. Call your doctor or get medical help if any of these side effects or any other side effects bother you or do not go away:  · Eye irritation.  · These are not all of the side effects that may occur. If you have questions about side effects, call your doctor. Call your doctor for medical advice about side effects.  · You may report side effects to your national health agency.  How is this drug best taken?   · Use this drug as ordered by your doctor. Read all information given to you. Follow all instructions closely.  · All products:  · For the eye only.  · Use as you have been told, even if you feel well.  · Wash your hands before and after use.  · Take out  contact lenses before using this drug. Lenses may be put back in 15 minutes after this drug is given. Do not put contacts back in if your eyes are irritated or infected.  · Do not touch the container tip to the eye, lid, or other skin.  · Tilt your head back and drop drug into the eye.  · After use, keep your eyes closed. Put pressure on the inside corner of the eye. Do this for 1 to 2 minutes. This keeps the drug in your eye.  · Suspension:  · Shake well before use.  What do I do if I miss a dose?   · Use a missed dose as soon as you think about it.  · If it is close to the time for your next dose, skip the missed dose and go back to your normal time.  · Do not use 2 doses or extra doses.  How do I store and/or throw out this drug?   · All products:  · Store at room temperature. Do not freeze.  · Keep all drugs out of the reach of children and pets.  · Check with your pharmacist about how to throw out unused drugs.  · Suspension:  · Store upright with the cap on.  General drug facts   · If your symptoms or health problems do not get better or if they become worse, call your doctor.  · Do not share your drugs with others and do not take anyone else's drugs.  · Keep a list of all your drugs (prescription, natural products, vitamins, OTC) with you. Give this list to your doctor.  · Talk with the doctor before starting any new drug, including prescription or OTC, natural products, or vitamins.  · Some drugs may have another patient information leaflet. If you have any questions about this drug, please talk with your doctor, pharmacist, or other health care provider.  · If you think there has been an overdose, call your poison control center or get medical care right away. Be ready to tell or show what was taken, how much, and when it happened.  Consumer Information Use and Disclaimer   · This information should not be used to decide whether or not to take this medicine or any other medicine. Only the healthcare  provider has the knowledge and training to decide which medicines are right for a specific patient. This information does not endorse any medicine as safe, effective, or approved for treating any patient or health condition. This is only   a brief summary of general information about this medicine. It does NOT include all information about the possible uses, directions, warnings, precautions, interactions, adverse effects, or risks that may apply to this medicine. This information is not specific medical advice and does not replace information you receive from the healthcare provider. You must talk with the healthcare provider for complete information about the risks and benefits of using this medicine.  Last Reviewed Date   2012-05-24  Copyright   · © 2015 [employer] and its affiliates and/or licensors. All rights reserved.

## 2018-01-16 NOTE — Procedures (Signed)
The r/b/a to YAG capsulotomy of both eyes were discussed.  After consent was obtained, the patient was brought into the YAG laser room and seated comfortably in the chair. The correct eyes were identified and marked.  A timeout was called to confirm the correct eyes and procedure.   A YAG capsulotomy was performed with the following settings:    YAG laser  YAG laser lens    Right eye   Power: 3.0 mJ  Number: 23    Left eye  Power: 3.0 mJ  Number: 21      The patient tolerated the procedure well. A complete posterior capsule opening was made.  A prescription for prednisolone acetate 1% was provided to be used QID for 7 days.  The patient will follow up in 2-3 weeks or sooner for new floaters/flashes/shadow over vision or vision loss.     I Vicenta AlyJoseph Howie, am scribing for and in the presence of Dr. Ladora Danielhristian Cesar Alf.  01/16/2018 10:56 AM

## 2018-01-16 NOTE — Progress Notes (Signed)
Outpatient Visit      Patient name: [name]  DOB: [birth year]       Age: 72 y.o.  MR#: [id number]    Encounter Date: 01/16/2018    Subjective:      Chief Complaint   Patient presents with    Laser Treatment     yag cap OU     HPI     Laser Treatment      Additional comments: yag cap OU              Comments      [name] is a 72 y.o. female with hx of PCIOL and PVD OU who   comes in for laser yag cap OU.    Patient considered after her last visit 9/19 and has come to the   conculsion that Va seems slightly blurry. Denies eye pain, floaters or   flashes of light.  She does note, however, issues with driving, especially     at night.                    Last edited by Ladora Daniel, MD on 01/16/2018 10:39 AM. (History)        has a current medication list which includes the following prescription(s): coenzyme q10, cetirizine, fluticasone, hydrocodone-chlorpheniramine polistirex, multiple vitamins-minerals, calcium carbonate-vitamin d, fish oil pearls, multivitamin, ranitidine, and coenzyme q10.     is allergic to sevoflurane and succinylcholine.      Past Medical History:   Diagnosis Date    Nephrolithiasis     Osteopenia     Tibia/fibula fracture 04/21/2012      Past Surgical History:   Procedure Laterality Date    ADENOIDECTOMY      CONVERTED PROCEDURE Left     Filleted Finger Flap     orif  2014    left ankle    PR REMV CATARACT EXTRACAP,INSERT LENS Left 03/16/2016    Procedure: PHACO W/ IOL;  Surgeon: Ladora Daniel, MD;  Location: [facility] MAIN OR;  Service: Ophthalmology    PR REMV CATARACT EXTRACAP,INSERT LENS Right 03/30/2016    Procedure: PHACO W/ IOL;  Surgeon: Ladora Daniel, MD;  Location: [facility] MAIN OR;  Service: Ophthalmology    TONSILLECTOMY          Specialty Problems        Ophthalmology Problems    Extramacular Drusen, right eye        PVD (posterior vitreous detachment), bilateral        Refractive error        Pseudophakia of left eye (SN60WF +15.5D 03/16/2016)         Pseudophakia of right eye (SN60WF +15.5D 03/30/16)        PCO (posterior capsular opacification), bilateral               ROS     Positive for: Eyes    Last edited by Chase Caller, COA on 01/16/2018 10:06 AM. (History)         Objective:     Base Eye Exam     Visual Acuity (Snellen - Linear)       Right Left    Dist cc 20/20 20/25   Va with phoropter           Tonometry (iCare tonometry (ICT), 10:16 AM)       Right Left    Pressure 14 15          Pupils  Dark Light React APD    Right 5 3 Slow None    Left 5 3 Slow None          Neuro/Psych     Oriented x3:  Yes    Mood/Affect:  Normal          Dilation     Both eyes:  2.5% Phenylephrine, 1.0% Tropicamide @ 10:14 AM            Additional Tests     Glare Testing (BAT)       Off Low Medium High    Right 20/20 20/25 20/25  20/25-1    Left 20/25 20/30 20/40  20/40            Slit Lamp and Fundus Exam     External Exam       Right Left    External Normal ocular adnexae, lacrimal gland & drainage, orbits Normal ocular adnexae, lacrimal gland & drainage, orbits          Slit Lamp Exam       Right Left    Lids/Lashes Normal structure & position Normal structure & position    Conjunctiva/Sclera Normal bulbar/palpebral, conjunctiva, sclera Normal bulbar/palpebral, conjunctiva, sclera    Cornea TCCI; mild spk TCCI; mild spk    Anterior Chamber Clear & deep Clear & deep    Iris Normal shape, size, morphology Normal shape, size, morphology    Lens PCIOL, 2+ PCO diffusely PCIOL, 1-2+ PCO    Vitreous PVD PVD          Fundus Exam       Right Left    Disc Normal size, appearance, nerve fiber layer Normal size, appearance, nerve fiber layer    C/D Ratio 0.25 0.25    Macula Normal Normal    Vessels Normal Normal    Periphery Drusen, Extramacular drusen, PVD PVD, No drusen            Refraction     Wearing Rx       Sphere Cylinder Axis Add    Right -0.75 -0.25 110 +2.50    Left -0.50 -0.25 075 +2.50    Type:  PAL                                Assessment/Plan:     1. PCO (posterior  capsular opacification), bilateral  Yag capsulotomy - OU - Both eys   2. Pseudophakia of left eye (SN60WF +15.5D 03/16/2016)     3. Pseudophakia of right eye (SN60WF +15.5D 03/30/16)     4. Extramacular Drusen, right eye     5. Refractive error     6. PVD (posterior vitreous detachment), bilateral          PLAN:  1. Visually significant PCO OU with regression in vision since immediate post operative period as well as glare causing difficulty with ADLs such as driving and reading.    Discussed r/b/a to YAG capsulotomy of both eyes including IOL decentration, RD, iritis, incomplete opening and possible need for repeat.  Patient elects to proceed.  Consent obtained. See procedure note for details.  Use pred acetate QID for 7 days.  F/u 2-3 weeks for MRx.  Discussed at length signs/sx RD.  Instructed to RTC for new floaters/flashes or shadow in vision.    2/3. PCIOL OU 2018    4. Stable    5. Update 2-3 weeks ME  6. Discussed at length signs/sx of retinal detachment.  Instructed to contact this office urgently for new floaters/flashes and/or shadow in vision.          I Vicenta AlyJoseph Howie, am scribing for and in the presence of Dr. Ladora Danielhristian Erman Thum.  01/16/2018 10:53 AM    Yag capsulotomy - OU - Both eys           See procedure note                 Patient was seen and examined.  Findings, assessment, and plan were discussed in detail with the patient, who verbalized understanding of and agreement with plan. All questions were answered. The patient was instructed to call or come in if there are any new symptoms or existing symptoms persist or worsen. To call if questions or concerns: [phone], number provided. Follow up was arranged.

## 2018-01-30 ENCOUNTER — Ambulatory Visit: Payer: Medicare (Managed Care) | Attending: Ophthalmology | Admitting: Ophthalmology

## 2018-01-30 VITALS — Ht 59.0 in | Wt 139.0 lb

## 2018-01-30 DIAGNOSIS — H43813 Vitreous degeneration, bilateral: Secondary | ICD-10-CM

## 2018-01-30 DIAGNOSIS — H35361 Drusen (degenerative) of macula, right eye: Secondary | ICD-10-CM

## 2018-01-30 DIAGNOSIS — Z961 Presence of intraocular lens: Secondary | ICD-10-CM

## 2018-01-30 DIAGNOSIS — H527 Unspecified disorder of refraction: Secondary | ICD-10-CM

## 2018-01-30 DIAGNOSIS — H26493 Other secondary cataract, bilateral: Secondary | ICD-10-CM

## 2018-01-30 NOTE — Progress Notes (Signed)
Outpatient Visit      Patient name: [name]  DOB: 06-17-1945       Age: 72 y.o.  MR#: [id number]    Encounter Date: 01/30/2018    Subjective:      Chief Complaint   Patient presents with    Follow-up     2 week s/p YAG Caps OU     HPI     Follow-up      Additional comments: 2 week s/p YAG Caps OU              Comments      [name] is a 72 y.o. female reports since having the laser   done   two weeks ago vision has improved. OU are comfortable. Denies   flashes/floaters    Ocular Meds None                Last edited by Vicenta Aly on 01/30/2018  2:10 PM. (History)        has a current medication list which includes the following prescription(s): coenzyme q10, cetirizine, fluticasone, hydrocodone-chlorpheniramine polistirex, multiple vitamins-minerals, calcium carbonate-vitamin d, fish oil pearls, multivitamin, ranitidine, and coenzyme q10.     is allergic to sevoflurane and succinylcholine.      Past Medical History:   Diagnosis Date    Nephrolithiasis     Osteopenia     Tibia/fibula fracture 04/21/2012      Past Surgical History:   Procedure Laterality Date    ADENOIDECTOMY      CONVERTED PROCEDURE Left     Filleted Finger Flap     orif  2014    left ankle    PR REMV CATARACT EXTRACAP,INSERT LENS Left 03/16/2016    Procedure: PHACO W/ IOL;  Surgeon: Ladora Daniel, MD;  Location: [facility] MAIN OR;  Service: Ophthalmology    PR REMV CATARACT EXTRACAP,INSERT LENS Right 03/30/2016    Procedure: PHACO W/ IOL;  Surgeon: Ladora Daniel, MD;  Location: [facility] MAIN OR;  Service: Ophthalmology    TONSILLECTOMY      YAG Capsulotomy OU Bilateral 01/16/2018        Specialty Problems        Ophthalmology Problems    Extramacular Drusen, right eye        PVD (posterior vitreous detachment), bilateral        Refractive error        Pseudophakia of left eye (SN60WF +15.5D 03/16/2016)        Pseudophakia of right eye (SN60WF +15.5D 03/30/16)        PCO (posterior capsular opacification), bilateral                ROS     Positive for: Eyes    Negative for: Constitutional, Gastrointestinal, Neurological, Skin,   Genitourinary, Musculoskeletal, HENT, Endocrine, Cardiovascular,   Respiratory, Psychiatric, Allergic/Imm, Heme/Lymph    Last edited by Coralee Rud on 01/30/2018  1:45 PM. (History)         Objective:     Base Eye Exam     Visual Acuity (Snellen - Linear)       Right Left    Dist cc 20/30 +2 20/30 +2    Correction:  Glasses          Pupils       Shape React APD    Right Round Brisk None    Left Round Brisk None          Extraocular Movement  Right Left     Full Full          Neuro/Psych     Oriented x3:  Yes    Mood/Affect:  Normal            Slit Lamp and Fundus Exam     External Exam       Right Left    External Normal ocular adnexae, lacrimal gland & drainage, orbits Normal ocular adnexae, lacrimal gland & drainage, orbits          Slit Lamp Exam       Right Left    Lids/Lashes Normal structure & position Normal structure & position    Conjunctiva/Sclera Normal bulbar/palpebral, conjunctiva, sclera Normal bulbar/palpebral, conjunctiva, sclera    Cornea TCCI; mild spk TCCI; mild spk    Anterior Chamber Clear & deep Clear & deep    Iris Normal shape, size, morphology Normal shape, size, morphology    Lens PCIOL, open PC PCIOL, open PC    Vitreous PVD PVD          Fundus Exam       Right Left    Disc Normal size, appearance, nerve fiber layer Normal size, appearance, nerve fiber layer    C/D Ratio 0.25 0.25    Macula Normal Normal    Vessels Normal Normal    Periphery Drusen, Extramacular drusen, PVD PVD, No drusen            Refraction     Wearing Rx       Sphere Cylinder Axis Add    Right -0.75 -0.25 110 +2.50    Left -0.50 -0.25 075 +2.50    Type:  PAL          Manifest Refraction       Sphere Cylinder Axis Dist VA Add    Right -0.50 -0.75 095 20/20-1 +2.50    Left -0.75 -0.25 075 20/20 +2.50          Final Rx       Sphere Cylinder Axis Dist VA Add    Right -0.50 -0.75 095 20/20-1 +2.50    Left  -0.75 -0.25 075 20/20 +2.50              Final Rx       Sphere Cylinder Axis Dist VA Add    Right -0.50 -0.75 095 20/20-1 +2.50    Left -0.75 -0.25 075 20/20 +2.50                        Assessment/Plan:     1. PCO (posterior capsular opacification), bilateral     2. Pseudophakia of left eye (SN60WF +15.5D 03/16/2016)     3. Pseudophakia of right eye (SN60WF +15.5D 03/30/16)     4. Extramacular Drusen, right eye     5. Refractive error     6. PVD (posterior vitreous detachment), bilateral          PLAN:  1. 2 week s/p YAG Capsulotomy OU. Noticed improvement.    2-3. PCIOL OU 2018    4. Stable    5. Updated today.    6. Discussed at length signs/sx of retinal detachment.  Instructed to contact this office urgently for new floaters/flashes and/or shadow in vision.      Pt is moving out of area. Will be able to transfer records at her request.        I Vicenta Aly, am scribing for and in  the presence of Dr. Ladora Danielhristian Klein.  01/30/2018 2:17 PM      Patient was seen and examined.  Findings, assessment, and plan were discussed in detail with the patient, who verbalized understanding of and agreement with plan. All questions were answered. The patient was instructed to call or come in if there are any new symptoms or existing symptoms persist or worsen. To call if questions or concerns: [phone], number provided. Follow up was arranged.

## 2018-06-21 ENCOUNTER — Telehealth: Payer: Self-pay | Admitting: Primary Care

## 2018-06-21 NOTE — Telephone Encounter (Signed)
Patient is moving out of [place] on 5/14. She is looking for a new primary and does not see this necessary.   Thanks

## 2022-01-08 IMAGING — MG MAMMOGRAPHY SCREENING BILATERAL 3[PERSON_NAME]
8 series · 8 of 24 positions shown · non-contrast
Comparison: Comparison was made to prior examinations.

________________________________________________________________________________________________ 
MAMMOGRAPHY SCREENING BILATERAL 3[facility] TIGER, 01/08/2022 [DATE]: 
CLINICAL INDICATION: Screening mammogram.
TECHNIQUE: Digital bilateral mammograms and 3-D Tomosynthesis were obtained. 
These were interpreted both primarily and with the aid of computer-aided 
detection system.  
BREAST DENSITY: (Level B) There are scattered areas of fibroglandular density.

[R MLO]
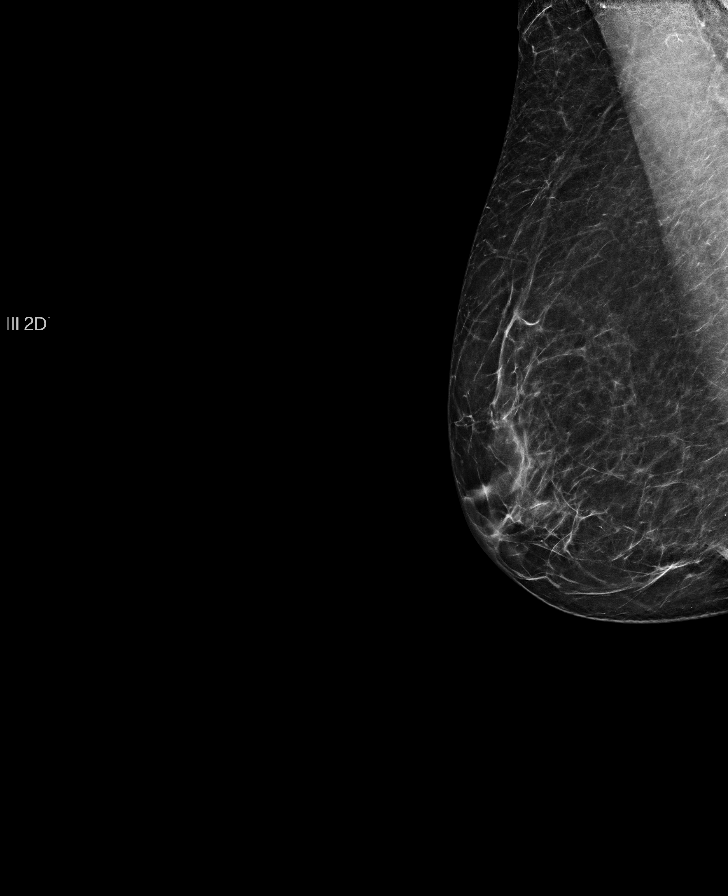

[R CC]
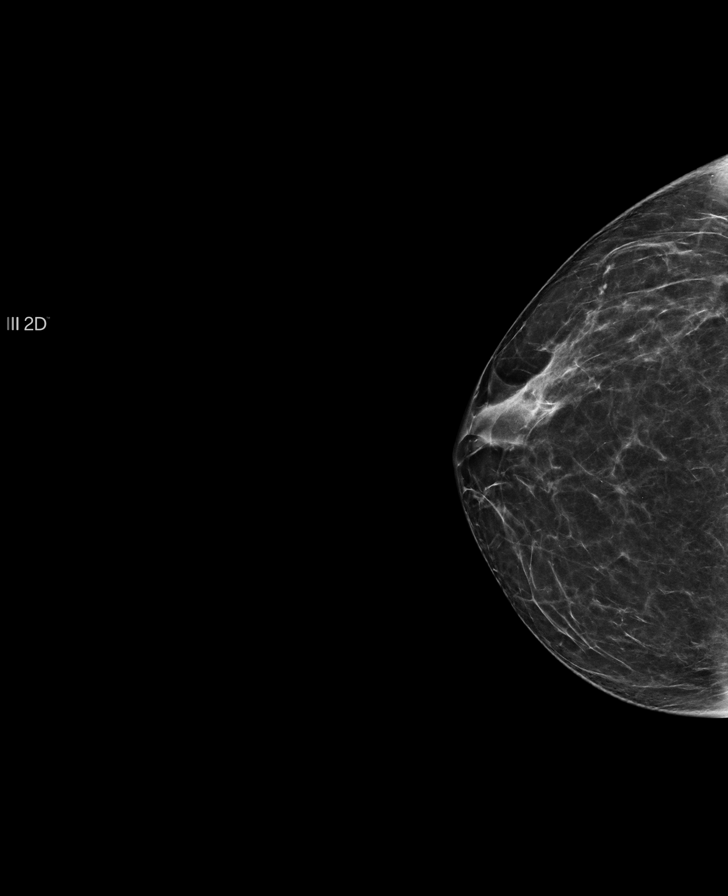

[L MLO]
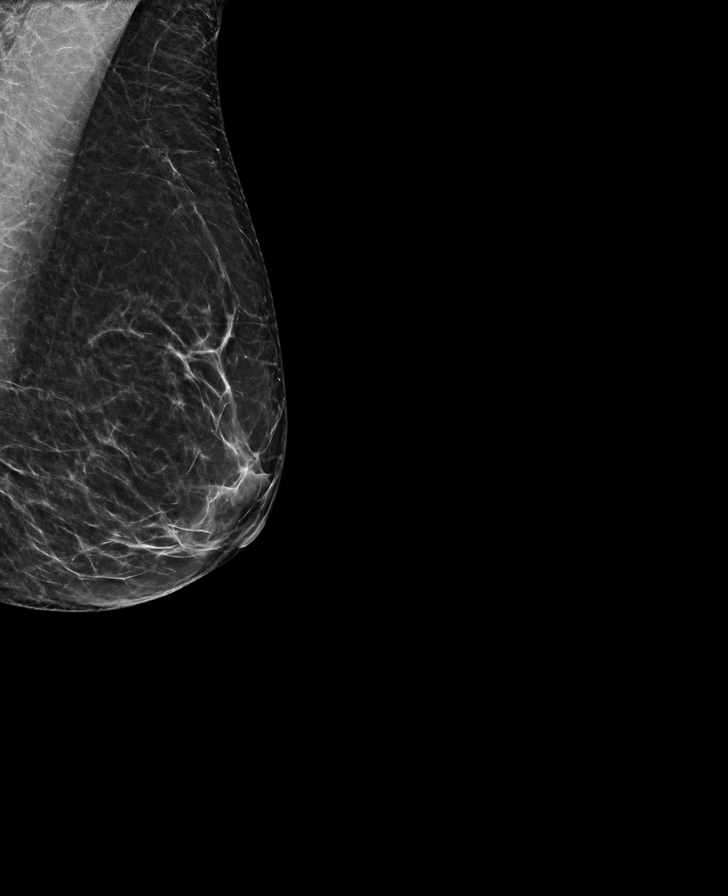

[L CC]
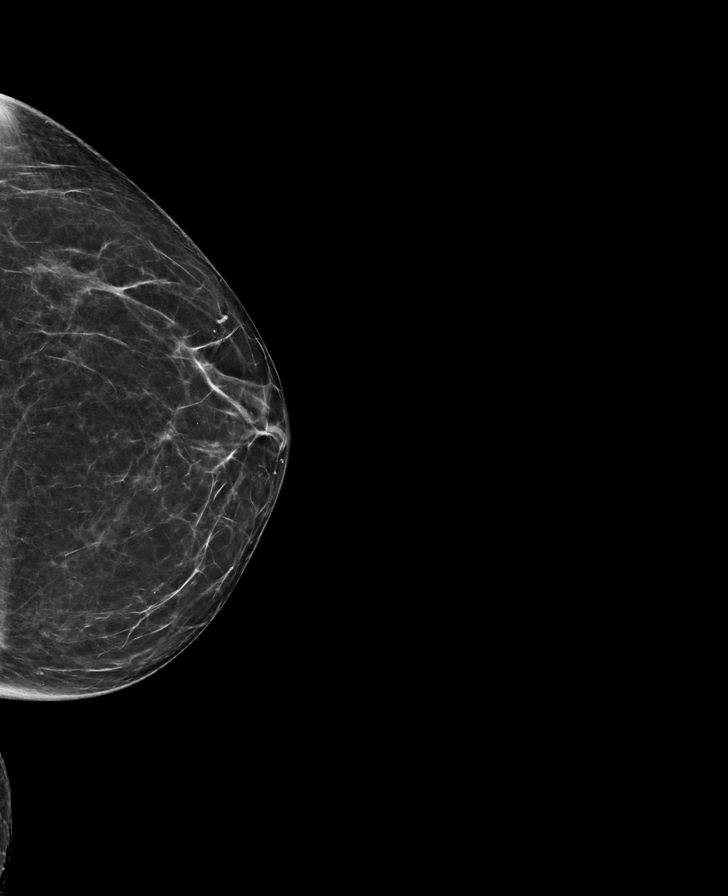

[L MLO tomo · tomo slice 32/63.0]
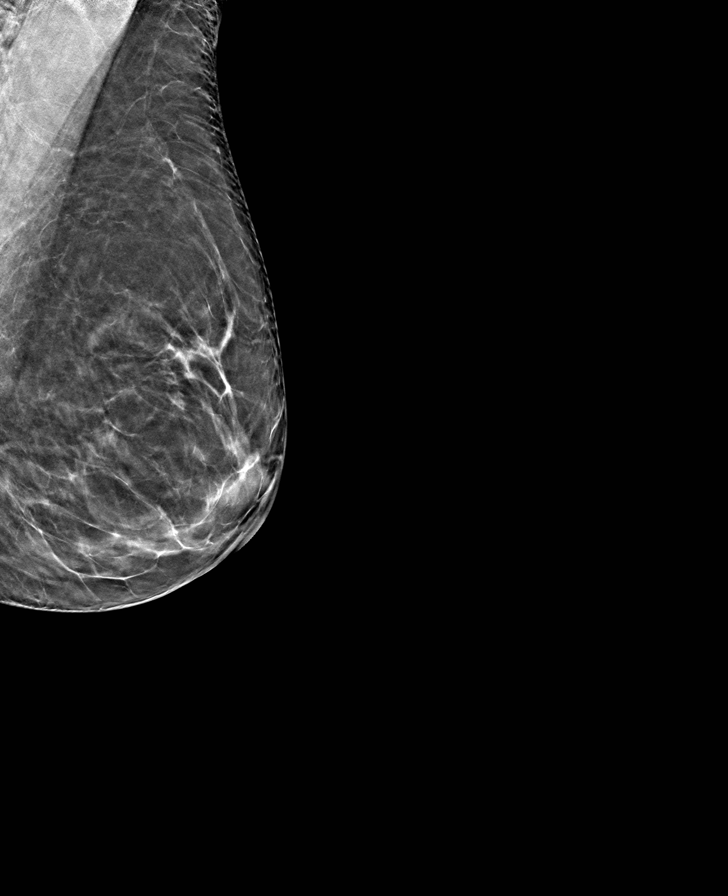

[R CC tomo · tomo slice 33/65.0]
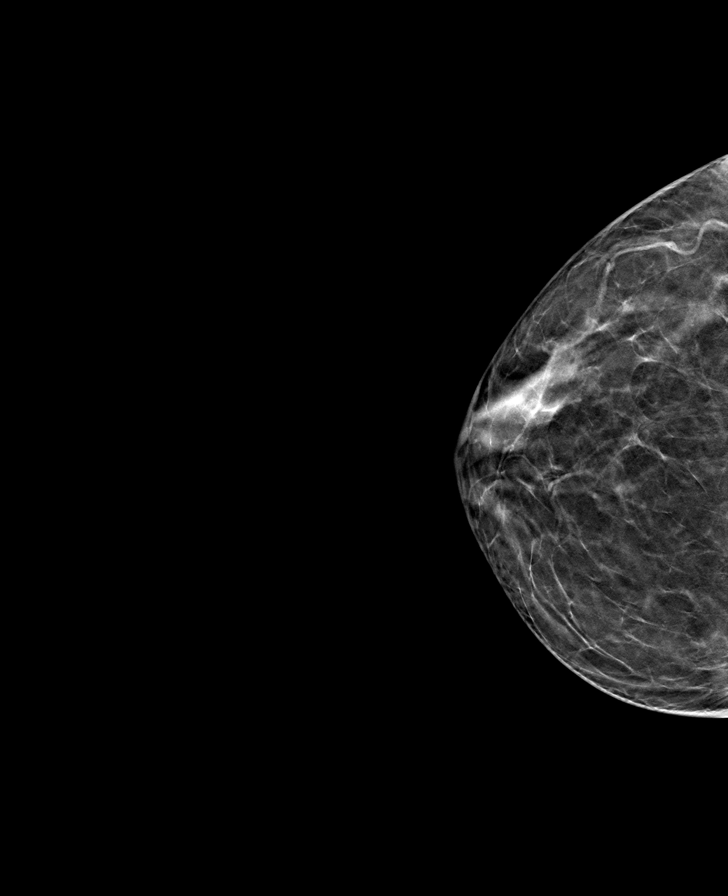

[L CC tomo · tomo slice 33/66.0]
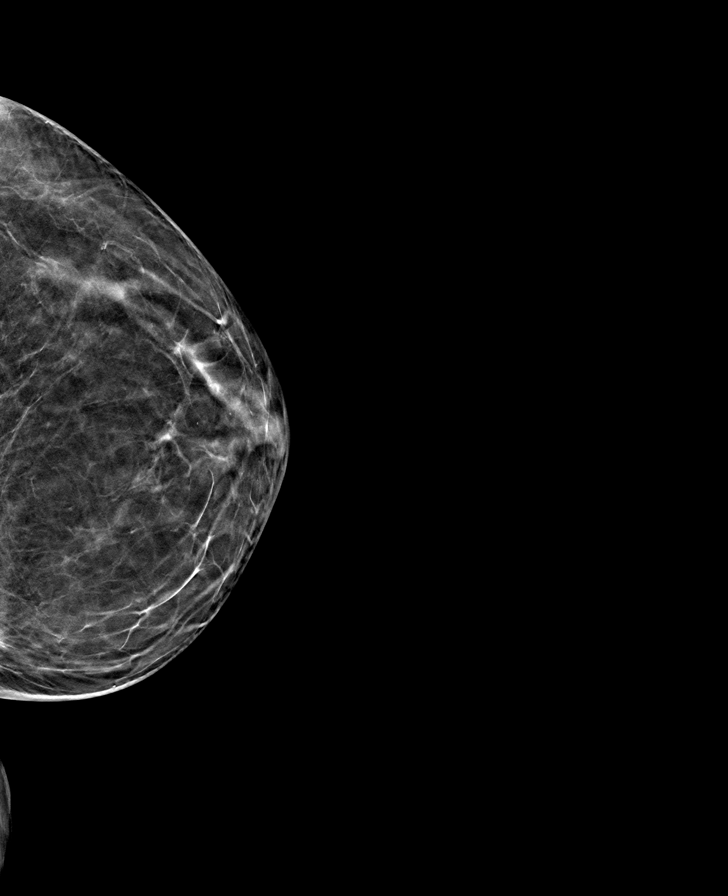

[R MLO tomo · tomo slice 35/70.0]
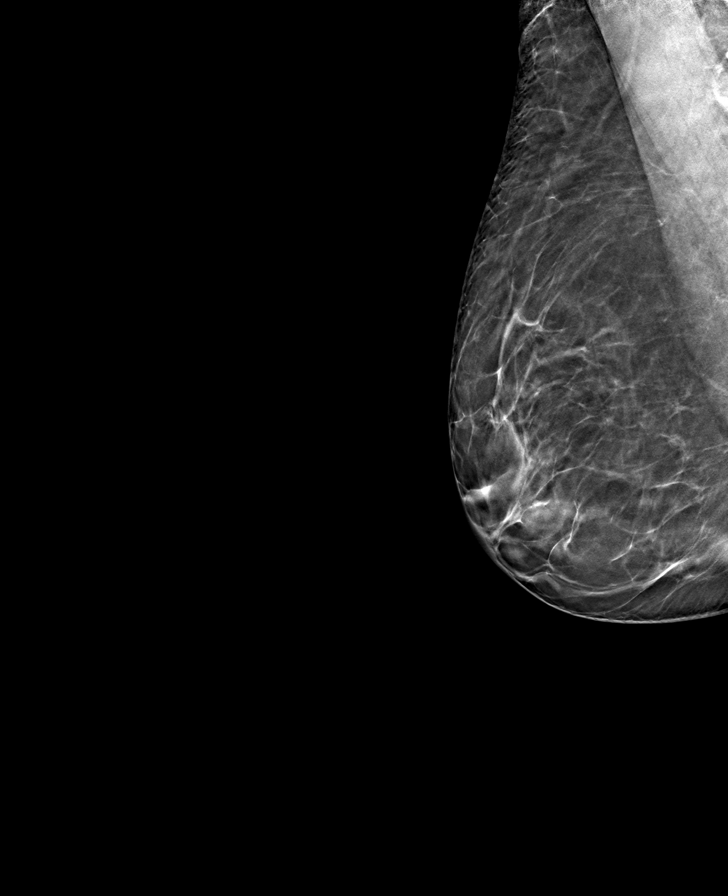

[8 of 24 positions shown; findings below may reference images not displayed]

FINDINGS: No mammographically suspicious abnormality and no significant change from prior 
mammograms.
IMPRESSION: (BI-RADS 1) Negative mammogram. Routine mammographic follow-up is recommended.

## 2022-02-14 IMAGING — MR MRI RIGHT KNEE WITHOUT CONTRAST
4 of 5 series · 25 of 40 positions shown · IV contrast (gadolinium)
Comparison: None

________________________________________________________________________________________________ 
MRI RIGHT KNEE WITHOUT CONTRAST, 02/14/2022 [DATE]: 
CLINICAL INDICATION: Twisted right knee 10 days ago while turning. Medial pain.
TECHNIQUE: Multiplanar, multiecho position MR images of the knee were performed 
without intravenous gadolinium enhancement. Patient was scanned on a
magnet.

[Series 301: PD fat-sat · axial · right · 3.0mm · 0.36mm/px · z∈[-41,+81]mm · 8 of 36 slices shown (1 of 3)]
[im 1/36]
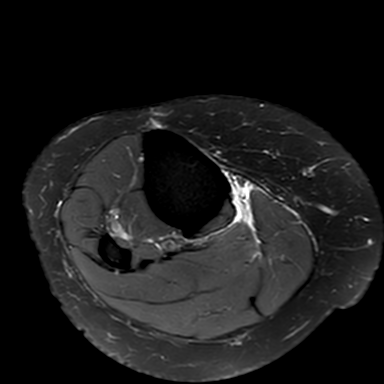
[im 4/36]
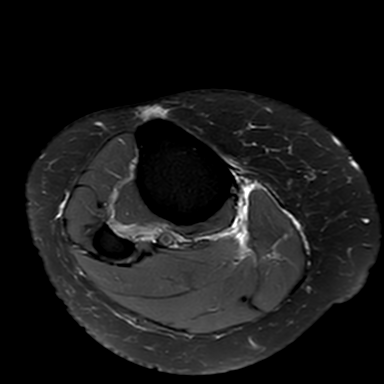
[im 12/36]
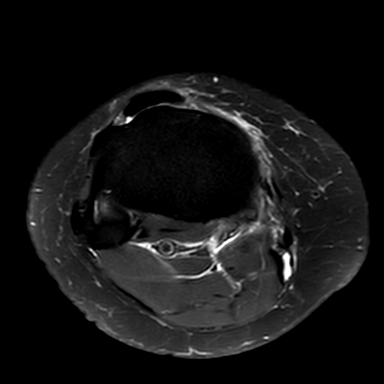
[im 16/36]
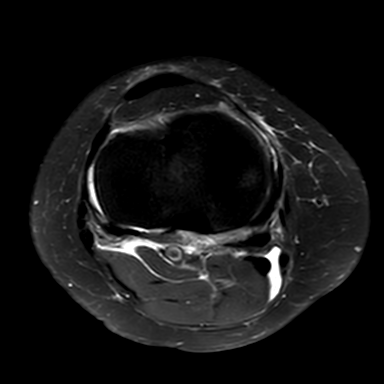
[im 20/36]
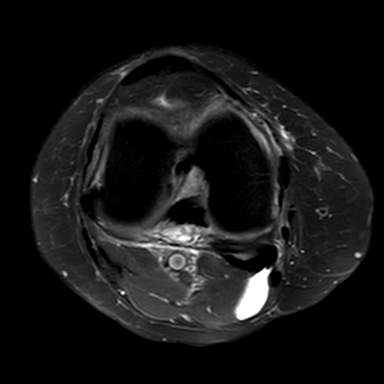
[im 24/36]
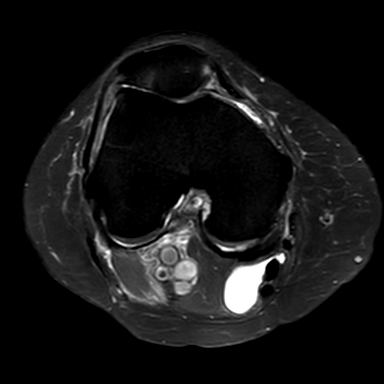
[im 32/36]
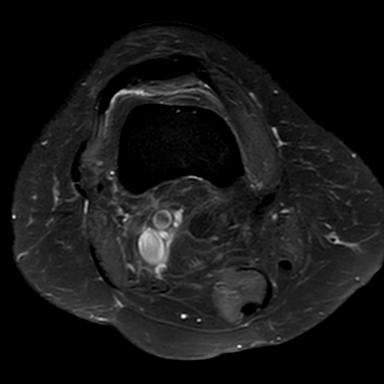
[im 36/36]
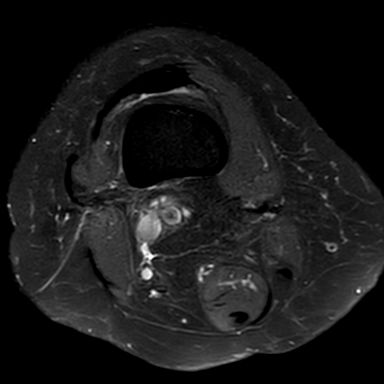

[Series 401: PD fat-sat · sagittal · right · 3.0mm · 0.29mm/px · 8 of 29 slices shown (2 of 3)]
[im 1/29]
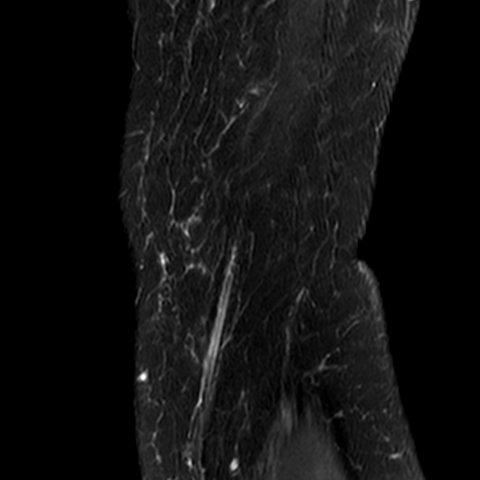
[im 5/29]
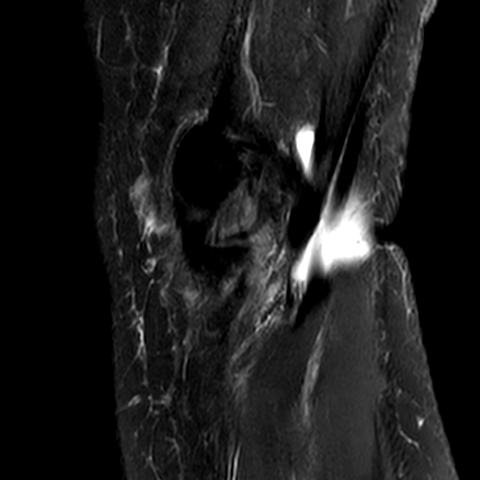
[im 9/29]
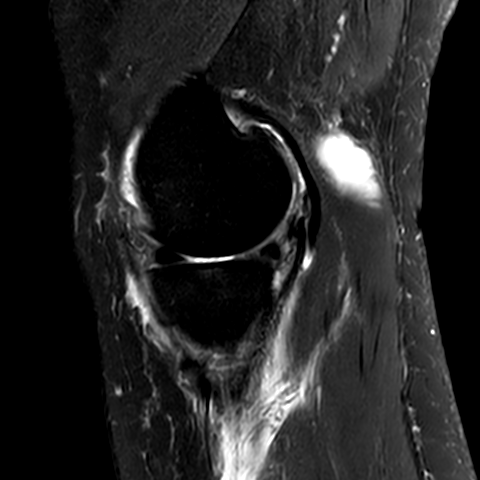
[im 13/29]
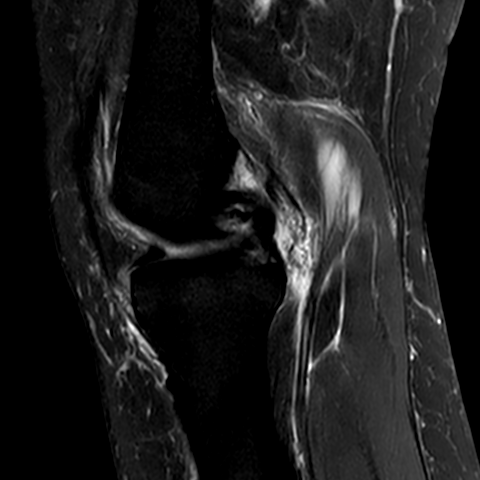
[im 17/29]
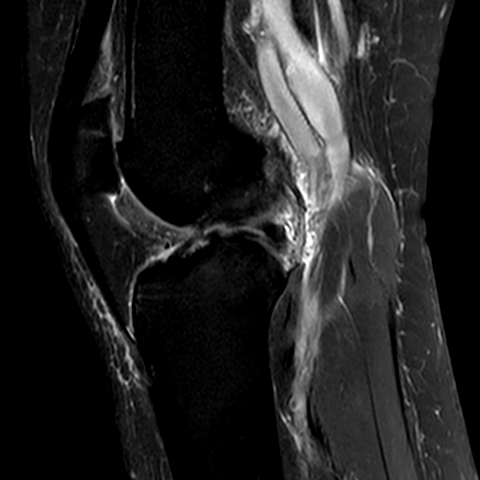
[im 21/29]
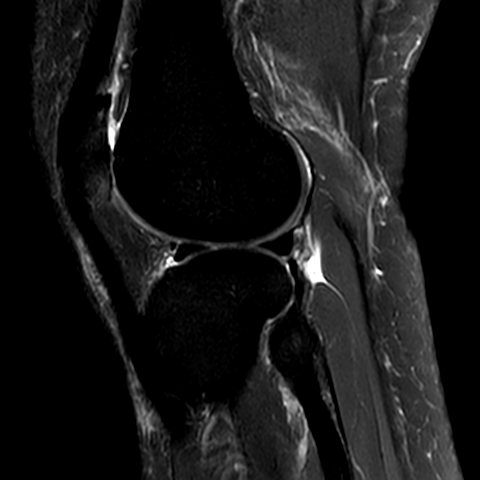
[im 25/29]
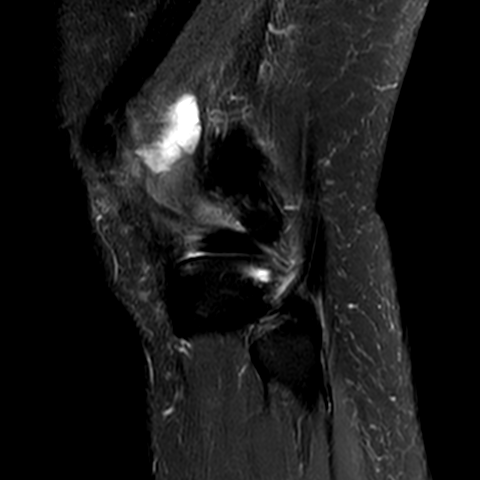
[im 29/29]
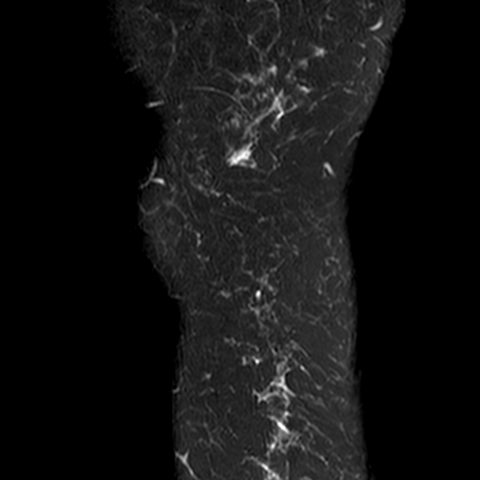

[Series 501: T1 · coronal · right · 3.0mm · 0.31mm/px · 3 of 32 slices shown]
[im 4/32]
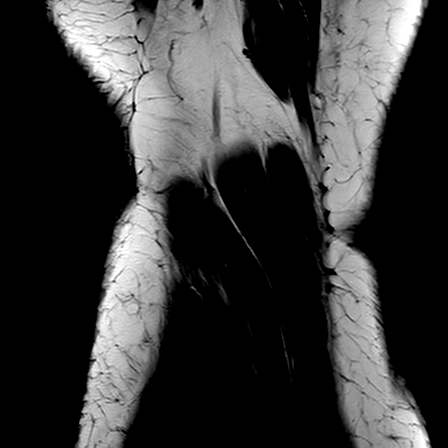
[im 16/32]
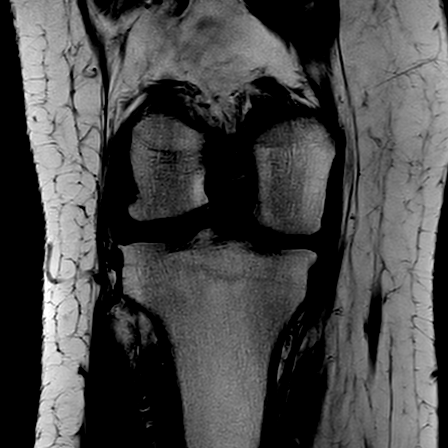
[im 28/32]
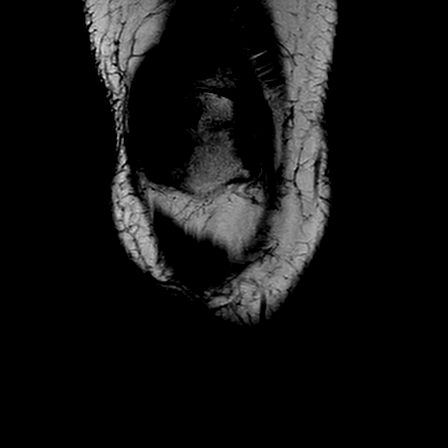

[Series 601: PD fat-sat · coronal · right · 3.0mm · 0.31mm/px · 6 of 32 slices shown (3 of 3)]
[im 1/32]
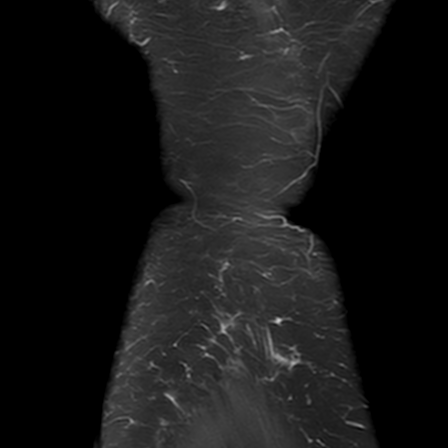
[im 4/32]
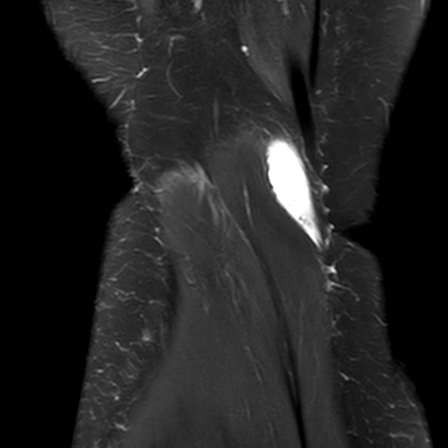
[im 8/32]
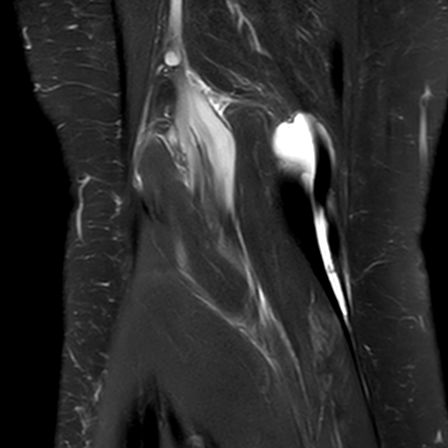
[im 12/32]
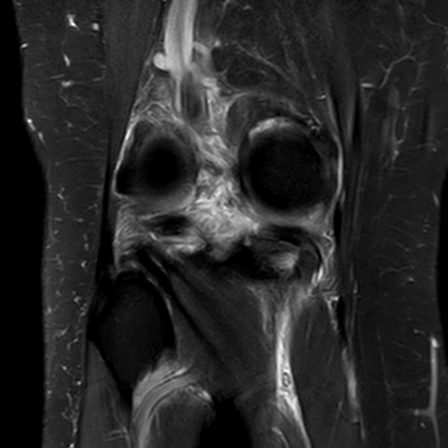
[im 16/32]
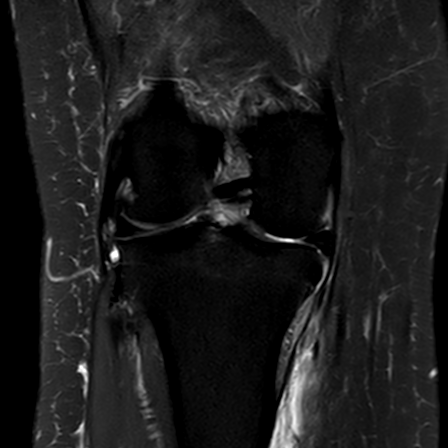
[im 28/32]
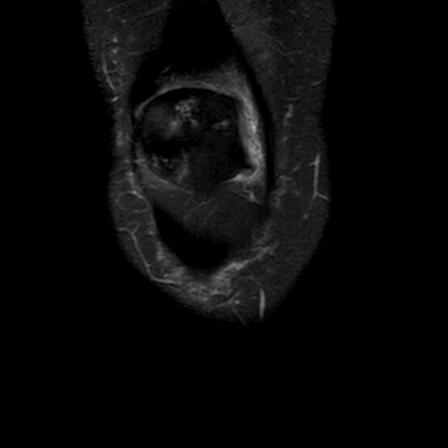

[25 of 40 positions shown; findings below may reference images not displayed]

FINDINGS: MEDIAL COMPARTMENT: There is a high-grade radial tear with a macerated 
appearance of the posterior horn at the juncture with the root of the medial 
meniscus. Advanced articular cartilaginous loss, grade 3 with nearly grade 4 
involving the mid weightbearing portion. 
LATERAL COMPARTMENT: The lateral meniscus is intact without tear or extrusion. 
Articular cartilage is preserved. 
PATELLOFEMORAL COMPARTMENT: There is lateral patellar subluxation and tilt. 
There is advanced, complete, grade 4 chondral loss of the patella the majority 
of the trochlear sulcus with some osseous remodeling. 
TIBIOFIBULAR COMPARTMENT: Negative. 
LIGAMENTS: The anterior cruciate ligament is intact. The posterior cruciate 
ligament is intact. The medial collateral ligament and lateral collateral 
ligaments are preserved. 
EXTENSOR MECHANISM: The quadriceps and patellar tendon are preserved. The medial 
and lateral retinacula are intact. 
POSTEROMEDIAL CORNER: The semimembranosus and pes anserine tendons are 
preserved. There is partial tearing with a thickened edematous appearance of 
posterior medial capsule. 
POSTEROLATERAL CORNER: The popliteal tendon and popliteofibular ligament are 
intact. The biceps femoris is negative. 
ADDITIONAL FINDINGS: No fracture. Mild marginal osteophytic spurring. Trace knee 
joint effusion. Small popliteal cyst. The musculature is normal without mass, 
signal abnormality or atrophy. There is a small amount of fluid extends along 
fascial planes of the posterior calf. Included neurovascular bundles are 
negative.
IMPRESSION: 1.  High-grade radial tear at the juncture of the posterior horn and root of the 
medial meniscus with a macerated appearance. 
2.  Advanced degenerative changes involving the patellofemoral and medial 
compartment. 
3.  Small knee joint effusion and popliteal cyst with suggestion of partial 
popliteal cyst decompression.

## 2023-02-24 IMAGING — MG MAMMOGRAPHY SCREENING BILATERAL 3[PERSON_NAME]
8 series · 8 of 24 positions shown · non-contrast
Comparison: Comparison was made to prior examinations.

________________________________________________________________________________________________ 
MAMMOGRAPHY SCREENING BILATERAL 3[facility] WALLS, 02/24/2023 [DATE]: 
CLINICAL INDICATION: Encounter for screening mammogram.
TECHNIQUE: Digital bilateral mammograms and 3-D Tomosynthesis were obtained. 
These were interpreted both primarily and with the aid of computer-aided 
detection system.  
BREAST DENSITY: (Level B) There are scattered areas of fibroglandular density.

[L CC]
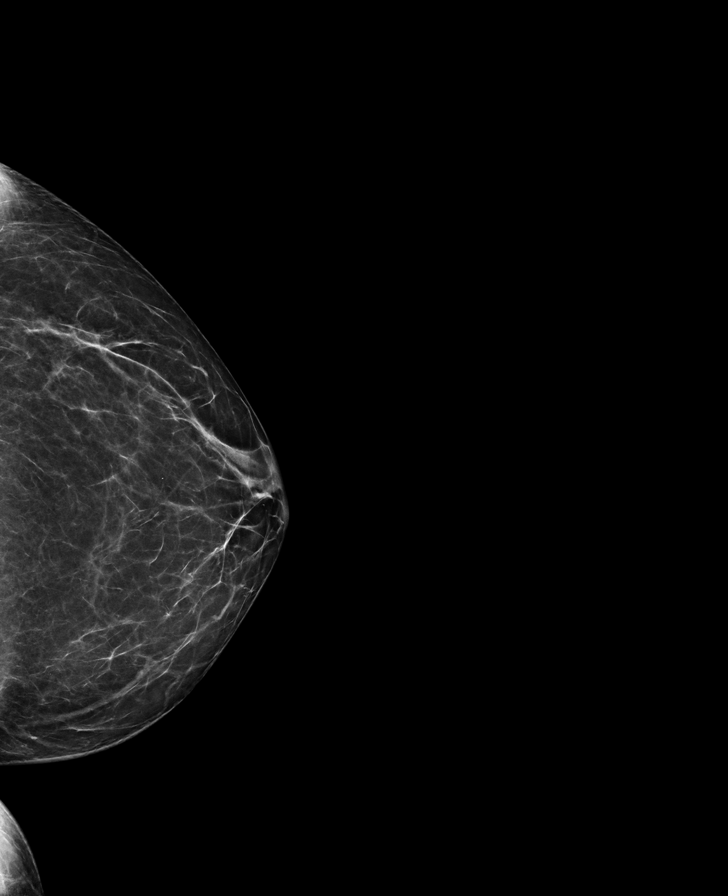

[L MLO]
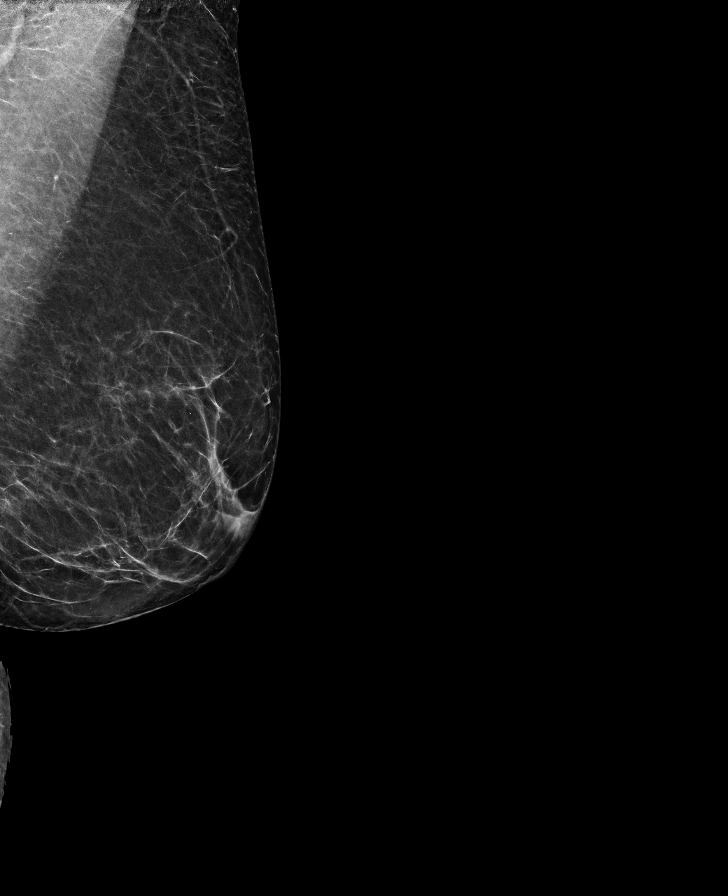

[R MLO]
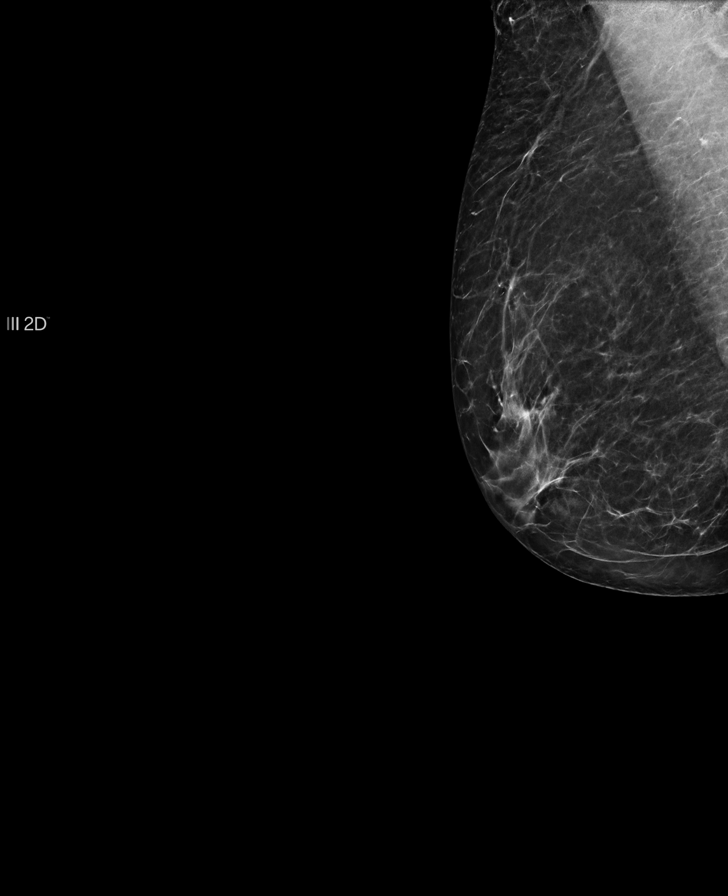

[R CC]
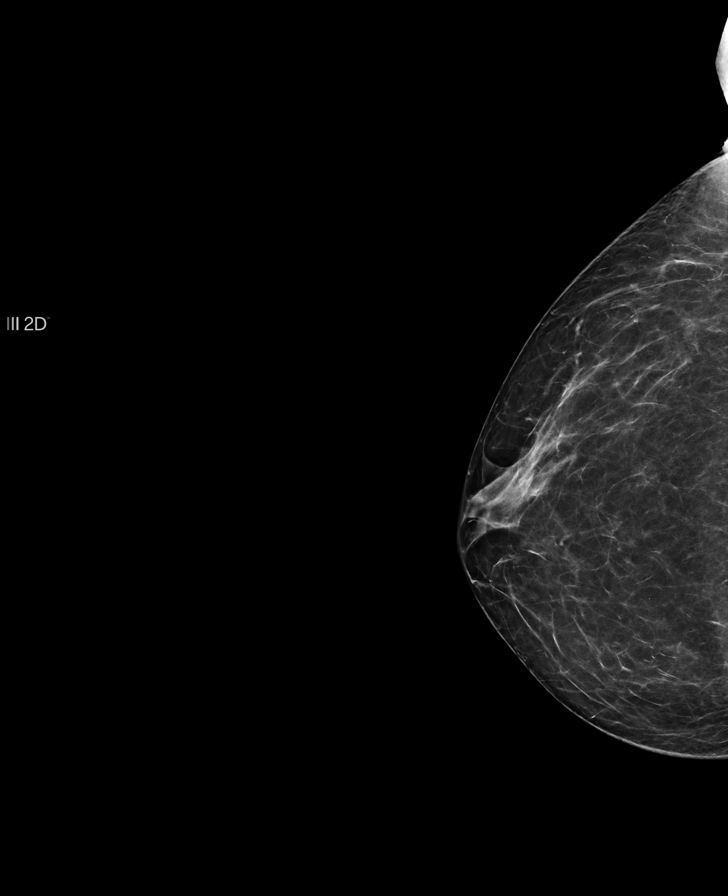

[L MLO tomo · tomo slice 11/21.0]
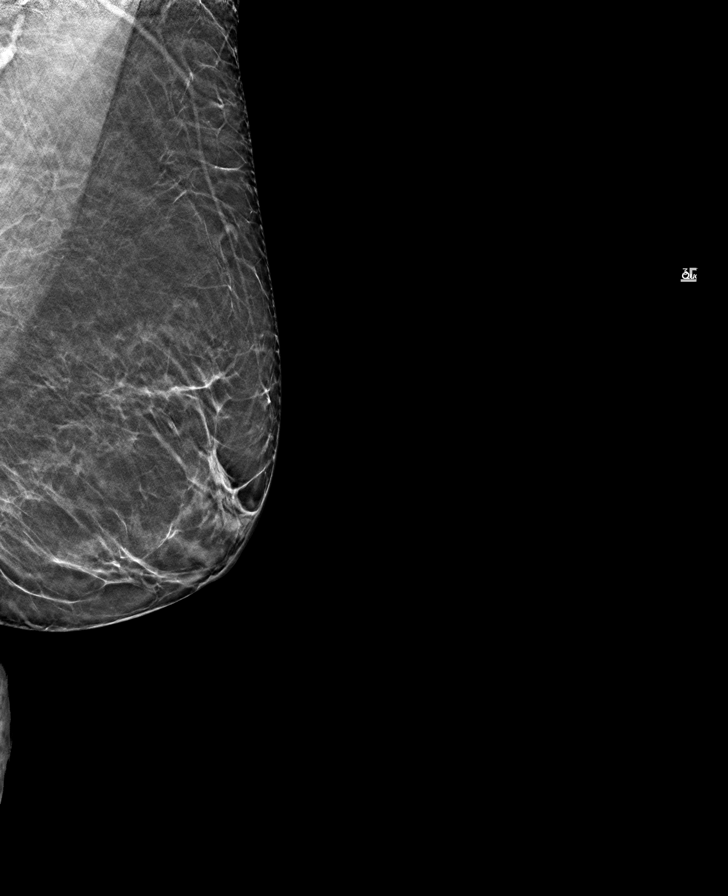

[R CC tomo · tomo slice 11/20.0]
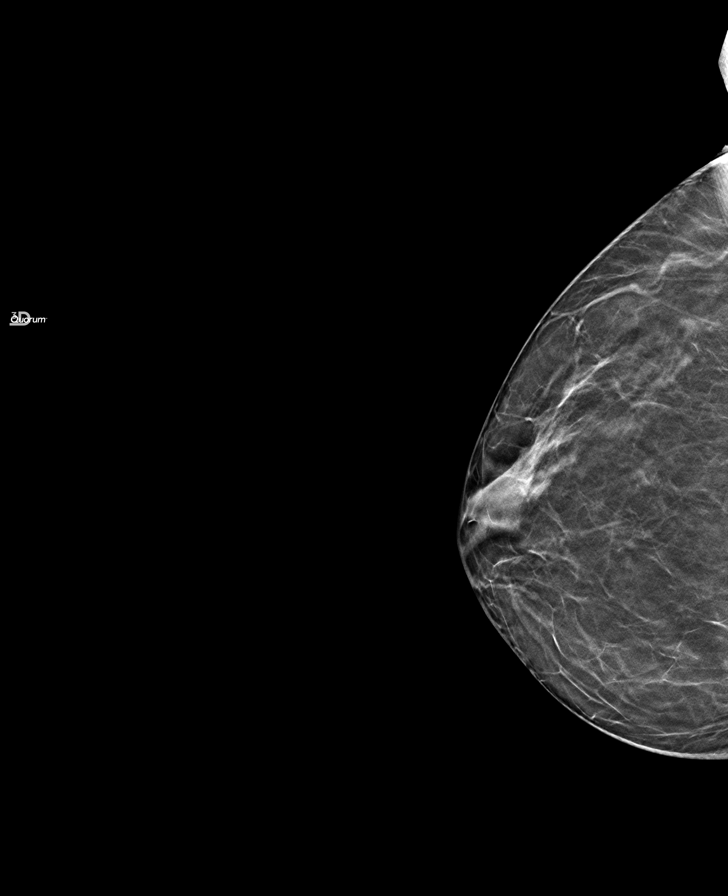

[L CC tomo · tomo slice 11/20.0]
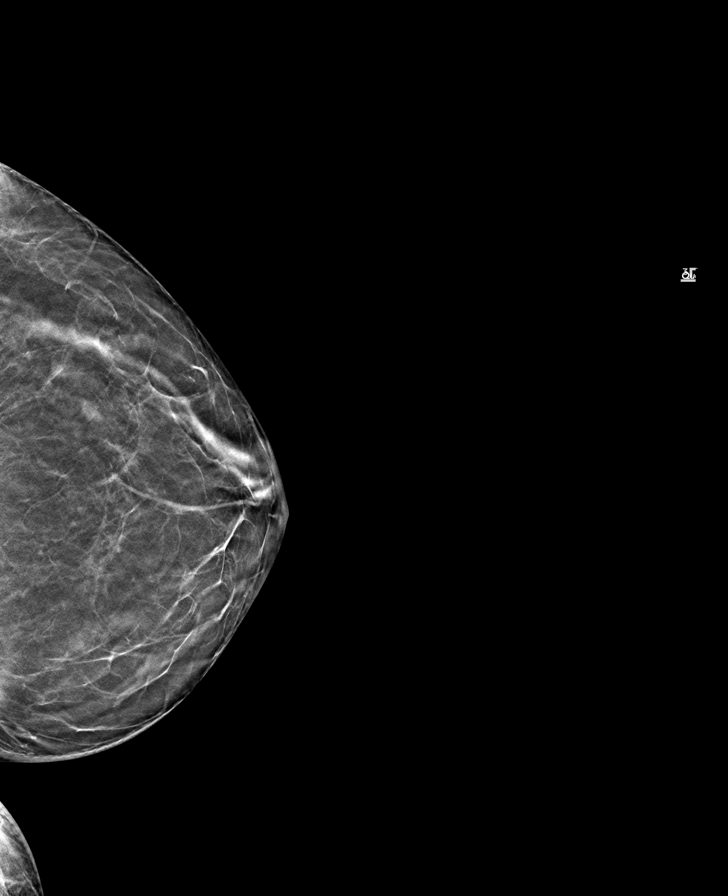

[R MLO tomo · tomo slice 11/21.0]
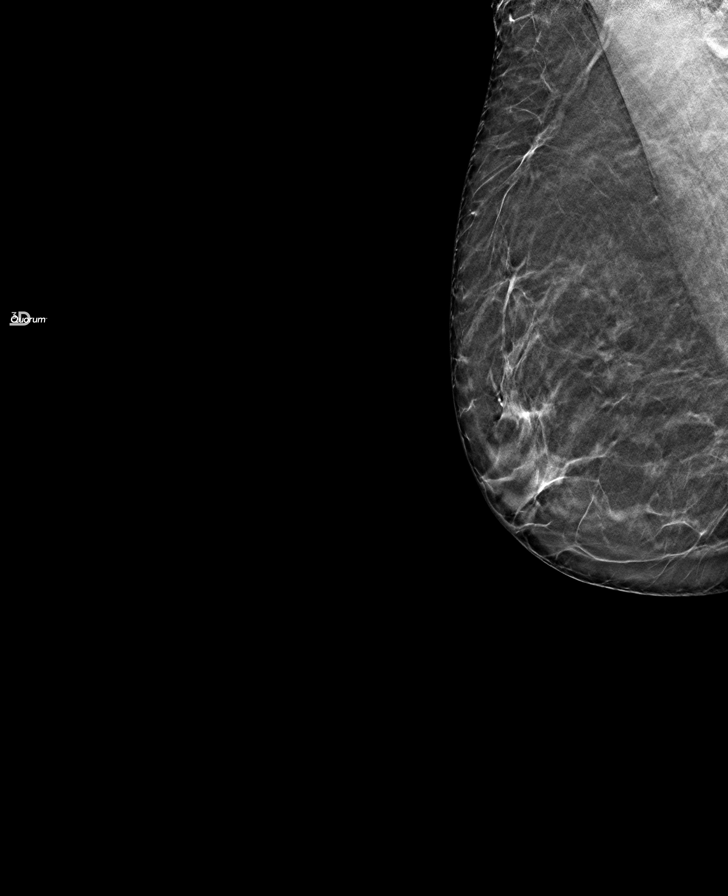

[8 of 24 positions shown; findings below may reference images not displayed]

FINDINGS: No suspicious mass, calcifications, or area of architectural 
distortion in either breast.
IMPRESSION: Stable exam. 
(BI-RADS 2) Benign findings. Routine mammographic follow-up is recommended.

## 2023-03-02 IMAGING — MR MRI RIGHT KNEE WITHOUT CONTRAST
4 of 5 series · 18 of 40 positions shown · IV contrast (gadolinium)
Comparison: 02/14/2022

________________________________________________________________________________________________ 
MRI RIGHT KNEE WITHOUT CONTRAST, 03/02/2023 [DATE]: 
CLINICAL INDICATION: Right knee pain. Twisting injury in [DATE] [DATE]. 02/24/23 
DXA demonstrated osteopenia.
TECHNIQUE: Multiplanar, multiecho position MR images of the right knee were 
performed without intravenous gadolinium enhancement.

[Series 301: PD fat-sat · axial · right · 3.0mm · 0.31mm/px · z∈[-71,+48]mm · 9 of 39 slices shown (1 of 3)]
[im 1/39]
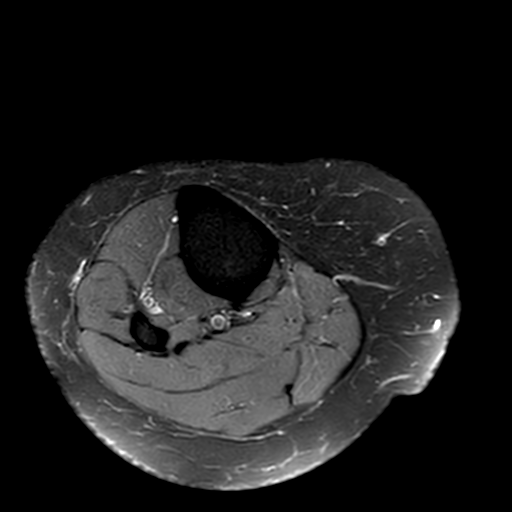
[im 4/39]
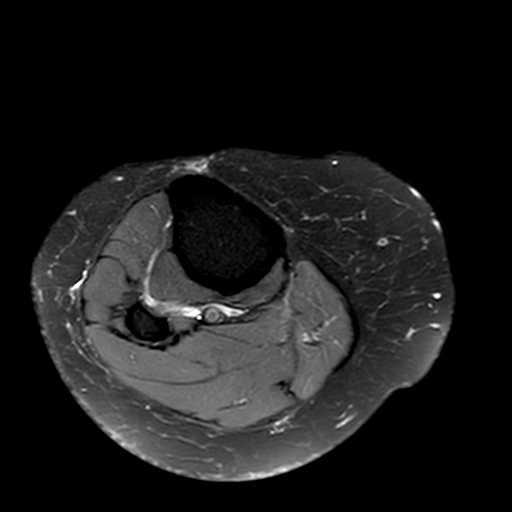
[im 8/39]
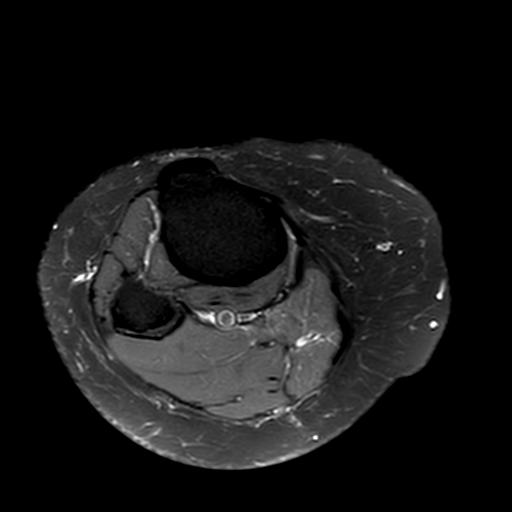
[im 12/39]
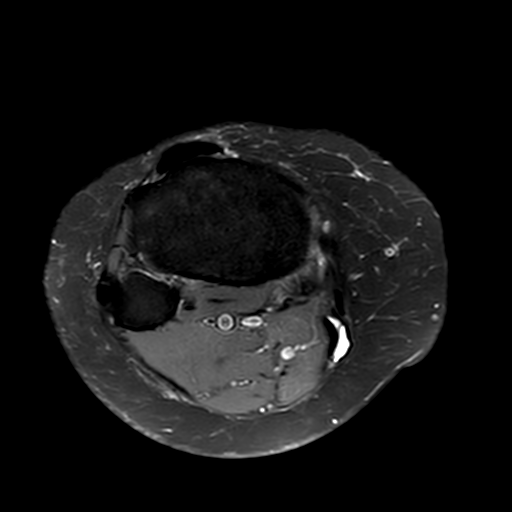
[im 16/39]
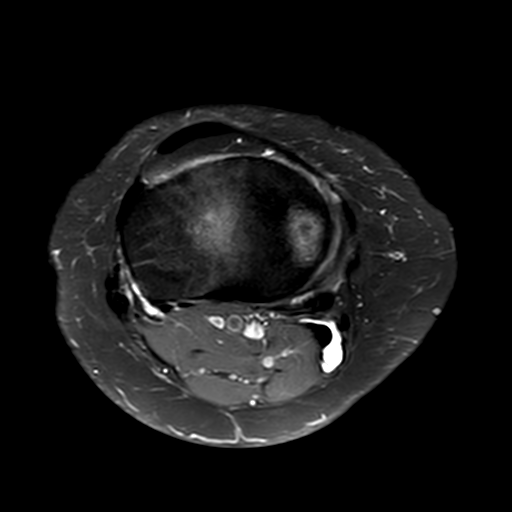
[im 20/39]
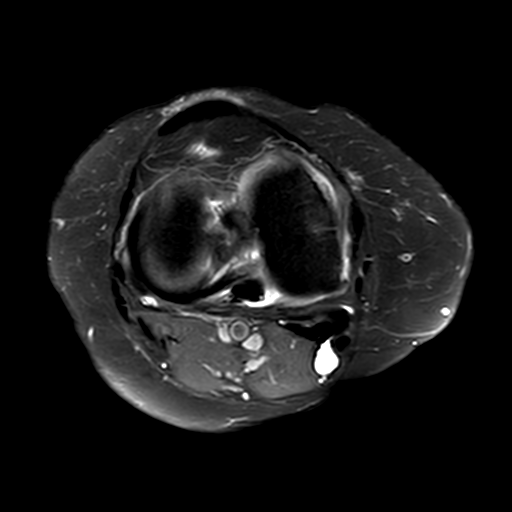
[im 23/39]
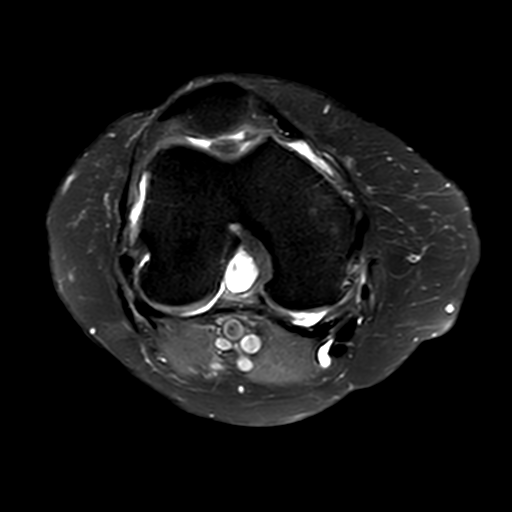
[im 27/39]
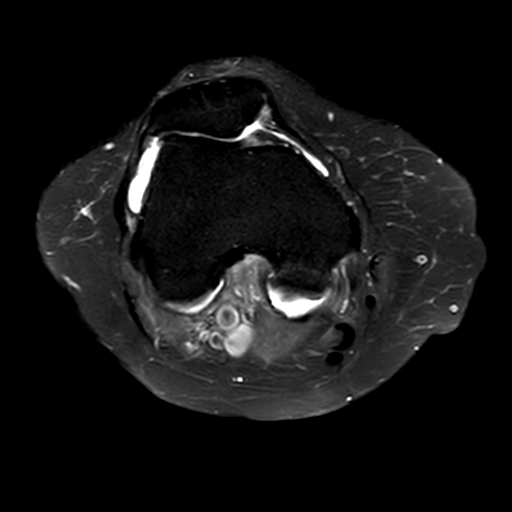
[im 35/39]
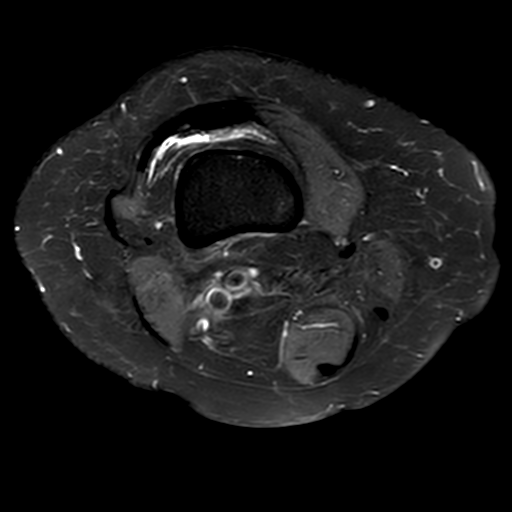

[Series 401: PD fat-sat · sagittal · right · 3.0mm · 0.28mm/px · 3 of 32 slices shown (2 of 3)]
[im 4/32]
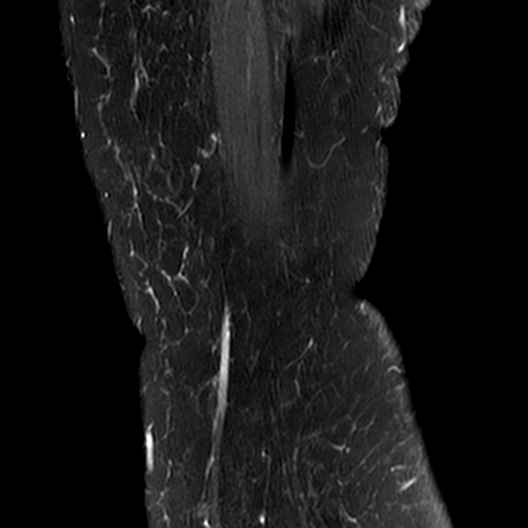
[im 16/32]
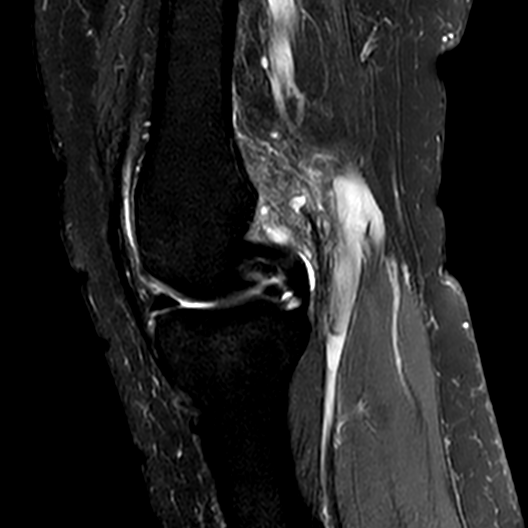
[im 28/32]
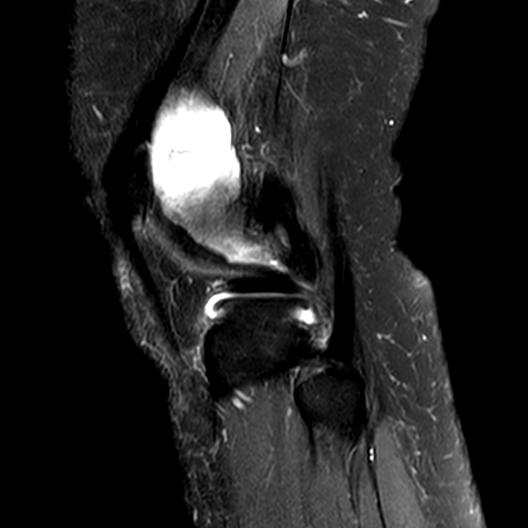

[Series 501: T1 · coronal · right · 3.0mm · 0.31mm/px · 3 of 32 slices shown]
[im 4/32]
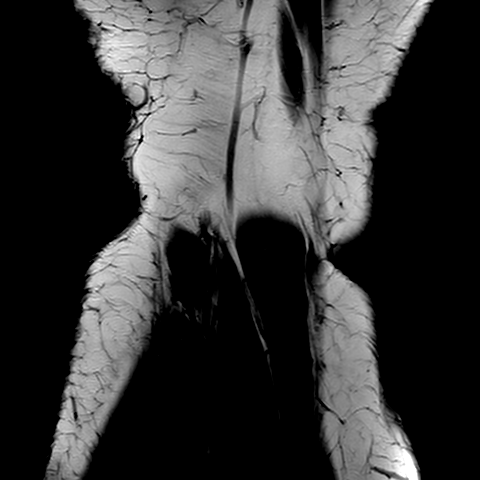
[im 16/32]
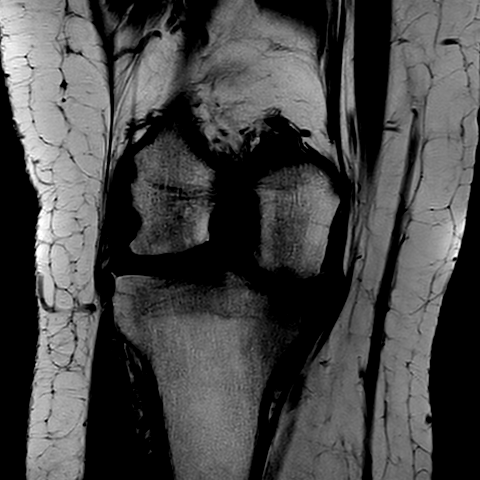
[im 28/32]
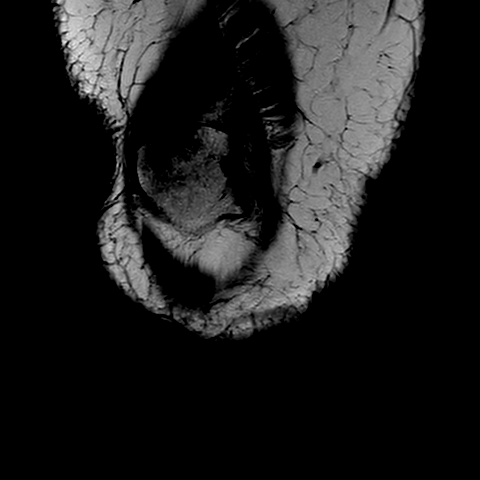

[Series 601: PD fat-sat · coronal · right · 3.0mm · 0.31mm/px · 3 of 32 slices shown (3 of 3)]
[im 4/32]
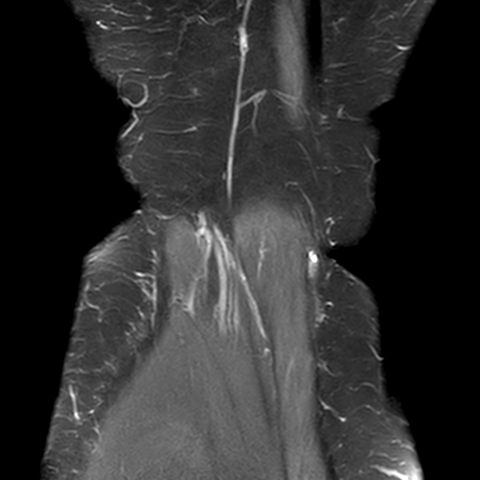
[im 16/32]
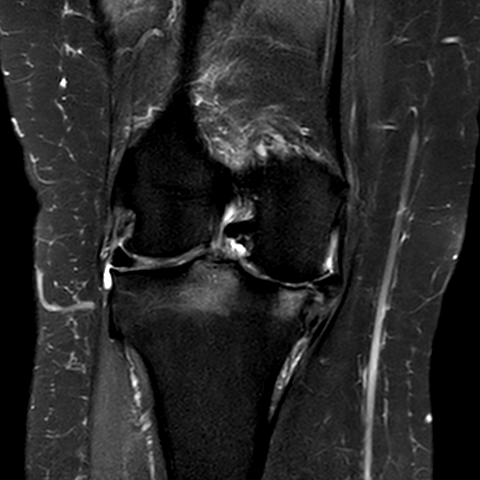
[im 28/32]
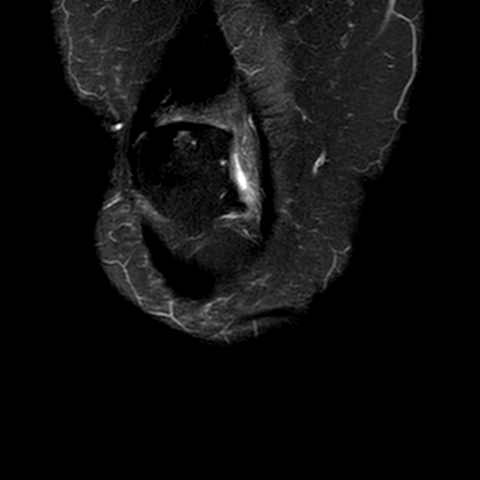

[18 of 40 positions shown; findings below may reference images not displayed]

FINDINGS: MEDIAL COMPARTMENT: Progressive complex tear and truncation of the posterior 
horn of the medial meniscus. Intrameniscal signal in the meniscal body without 
discrete tear. Progressive medial meniscal extrusion (0.4 cm). Subacute medial 
tibial plateau subchondral insufficiency fracture. Progressive medial 
compartment degenerative change with up to grade III chondromalacia, bone marrow 
edema and marginal/central osteophytes. 
LATERAL COMPARTMENT: The lateral meniscus is intact without tear or extrusion. 
Up to grade II chondromalacia. Marginal and central osteophytes.  
PATELLOFEMORAL COMPARTMENT: The patella is centrally located. Up to grade IV 
chondromalacia and subcortical cystic change. 
PROXIMAL TIBIOFIBULAR JOINT: Preserved. 
LIGAMENTS: The anterior cruciate ligament is intact. The posterior cruciate 
ligament is intact. The medial collateral ligament and lateral collateral 
ligament are preserved.  
EXTENSOR MECHANISM: The quadriceps and patellar tendon are preserved. The medial 
and lateral retinacula are intact. 
POSTEROMEDIAL CORNER: The semimembranosus and pes anserine tendons are 
preserved. The posterior oblique ligament and posterior medial joint capsule are 
intact. 
POSTEROLATERAL CORNER: The popliteal tendon and popliteofibular ligament are 
intact. The biceps femoris is negative. 
BONES AND SOFT TISSUES: Subacute medial tibial plateau subchondral insufficiency 
fracture measures 1.3 x 1.3 cm in cross-section with up to 0.1 cm impaction and 
subjacent bone marrow edema. New mild medial femoral condylar bone marrow edema. 
New 0.7 cm subcortical cyst in the base of the lateral tibial tubercle with 
subjacent bone marrow edema. Small joint effusion and mild synovitis. Small 
popliteal cyst. The musculature is symmetric without mass, signal abnormality or 
atrophy. Neurovascular bundles are negative. Subcutaneous tissues are negative.
IMPRESSION: 1.  Subacute 1.3 cm medial tibial plateau subchondral insufficiency fracture and 
new mild medial femoral condylar bone marrow edema are attributed to meniscal 
tear. 
2.  Progressive complex tear/truncation of the posterior horn of the medial 
meniscus and medial meniscal extrusion. 
3.  Progressive marked medial compartment and mild lateral compartment 
degenerative change. Stable marked patellofemoral joint degenerative change. 
4.  Small joint effusion, mild synovitis and small popliteal cyst.

## 8386-10-24 DEATH — deceased
